# Patient Record
Sex: Male | Born: 1945
Health system: Southern US, Community
[De-identification: ages and names within clinical notes are randomized; demographics above are authoritative.]

## PROBLEM LIST (undated history)

## (undated) DIAGNOSIS — I10 Essential (primary) hypertension: Secondary | ICD-10-CM

## (undated) DIAGNOSIS — E119 Type 2 diabetes mellitus without complications: Secondary | ICD-10-CM

## (undated) DIAGNOSIS — H269 Unspecified cataract: Secondary | ICD-10-CM

## (undated) HISTORY — DX: Unspecified cataract: H26.9

## (undated) HISTORY — DX: Type 2 diabetes mellitus without complications: E11.9

## (undated) HISTORY — PX: HERNIA REPAIR: SHX51

## (undated) HISTORY — PX: ROTATOR CUFF REPAIR: SHX139

---

## 2000-04-23 ENCOUNTER — Emergency Department (HOSPITAL_COMMUNITY): Admission: EM | Admit: 2000-04-23 | Discharge: 2000-04-23 | Payer: Self-pay

## 2000-09-14 ENCOUNTER — Emergency Department (HOSPITAL_COMMUNITY): Admission: EM | Admit: 2000-09-14 | Discharge: 2000-09-15 | Payer: Self-pay | Admitting: Emergency Medicine

## 2000-09-15 ENCOUNTER — Emergency Department (HOSPITAL_COMMUNITY): Admission: EM | Admit: 2000-09-15 | Discharge: 2000-09-16 | Payer: Self-pay | Admitting: Emergency Medicine

## 2000-11-27 ENCOUNTER — Encounter: Payer: Self-pay | Admitting: Emergency Medicine

## 2000-11-27 ENCOUNTER — Inpatient Hospital Stay (HOSPITAL_COMMUNITY): Admission: EM | Admit: 2000-11-27 | Discharge: 2000-11-28 | Payer: Self-pay | Admitting: Emergency Medicine

## 2000-11-28 ENCOUNTER — Emergency Department (HOSPITAL_COMMUNITY): Admission: EM | Admit: 2000-11-28 | Discharge: 2000-11-29 | Payer: Self-pay | Admitting: Emergency Medicine

## 2001-03-05 ENCOUNTER — Emergency Department (HOSPITAL_COMMUNITY): Admission: EM | Admit: 2001-03-05 | Discharge: 2001-03-05 | Payer: Self-pay

## 2003-02-08 ENCOUNTER — Emergency Department (HOSPITAL_COMMUNITY): Admission: EM | Admit: 2003-02-08 | Discharge: 2003-02-09 | Payer: Self-pay | Admitting: Emergency Medicine

## 2004-05-05 ENCOUNTER — Inpatient Hospital Stay (HOSPITAL_COMMUNITY): Admission: RE | Admit: 2004-05-05 | Discharge: 2004-05-11 | Payer: Self-pay | Admitting: Psychiatry

## 2004-05-05 ENCOUNTER — Emergency Department (HOSPITAL_COMMUNITY): Admission: EM | Admit: 2004-05-05 | Discharge: 2004-05-05 | Payer: Self-pay | Admitting: Emergency Medicine

## 2004-05-05 ENCOUNTER — Ambulatory Visit: Payer: Self-pay | Admitting: Psychiatry

## 2004-06-10 ENCOUNTER — Ambulatory Visit: Payer: Self-pay

## 2012-03-28 LAB — HM COLONOSCOPY: HM Colonoscopy: NORMAL

## 2014-08-04 ENCOUNTER — Ambulatory Visit: Payer: Self-pay | Admitting: Family Medicine

## 2015-06-30 ENCOUNTER — Telehealth: Payer: Self-pay | Admitting: Behavioral Health

## 2015-06-30 ENCOUNTER — Encounter: Payer: Self-pay | Admitting: Behavioral Health

## 2015-06-30 NOTE — Telephone Encounter (Signed)
Pre-Visit Call completed with patient and chart updated.   Pre-Visit Info documented in Specialty Comments under [employer].    

## 2015-07-02 ENCOUNTER — Ambulatory Visit: Payer: Self-pay | Admitting: Family Medicine

## 2017-04-04 DIAGNOSIS — E1129 Type 2 diabetes mellitus with other diabetic kidney complication: Secondary | ICD-10-CM | POA: Diagnosis not present

## 2017-04-04 DIAGNOSIS — Z7984 Long term (current) use of oral hypoglycemic drugs: Secondary | ICD-10-CM | POA: Diagnosis not present

## 2017-04-04 DIAGNOSIS — H2513 Age-related nuclear cataract, bilateral: Secondary | ICD-10-CM | POA: Diagnosis not present

## 2017-04-04 DIAGNOSIS — H25013 Cortical age-related cataract, bilateral: Secondary | ICD-10-CM | POA: Diagnosis not present

## 2017-04-07 DIAGNOSIS — E785 Hyperlipidemia, unspecified: Secondary | ICD-10-CM | POA: Diagnosis not present

## 2017-04-07 DIAGNOSIS — F5101 Primary insomnia: Secondary | ICD-10-CM | POA: Diagnosis not present

## 2017-04-30 ENCOUNTER — Emergency Department (HOSPITAL_BASED_OUTPATIENT_CLINIC_OR_DEPARTMENT_OTHER)
Admission: EM | Admit: 2017-04-30 | Discharge: 2017-04-30 | Disposition: A | Discharge status: Home / Self Care | Payer: PPO | Attending: Emergency Medicine | Admitting: Emergency Medicine

## 2017-04-30 ENCOUNTER — Emergency Department (HOSPITAL_BASED_OUTPATIENT_CLINIC_OR_DEPARTMENT_OTHER): Payer: PPO

## 2017-04-30 ENCOUNTER — Encounter (HOSPITAL_BASED_OUTPATIENT_CLINIC_OR_DEPARTMENT_OTHER): Payer: Self-pay | Admitting: *Deleted

## 2017-04-30 ENCOUNTER — Other Ambulatory Visit: Payer: Self-pay

## 2017-04-30 DIAGNOSIS — E119 Type 2 diabetes mellitus without complications: Secondary | ICD-10-CM | POA: Diagnosis not present

## 2017-04-30 DIAGNOSIS — J069 Acute upper respiratory infection, unspecified: Secondary | ICD-10-CM | POA: Insufficient documentation

## 2017-04-30 DIAGNOSIS — R05 Cough: Secondary | ICD-10-CM | POA: Diagnosis not present

## 2017-04-30 DIAGNOSIS — Z79899 Other long term (current) drug therapy: Secondary | ICD-10-CM | POA: Insufficient documentation

## 2017-04-30 DIAGNOSIS — I10 Essential (primary) hypertension: Secondary | ICD-10-CM | POA: Insufficient documentation

## 2017-04-30 DIAGNOSIS — Z7984 Long term (current) use of oral hypoglycemic drugs: Secondary | ICD-10-CM | POA: Insufficient documentation

## 2017-04-30 DIAGNOSIS — R9431 Abnormal electrocardiogram [ECG] [EKG]: Secondary | ICD-10-CM | POA: Diagnosis not present

## 2017-04-30 HISTORY — DX: Essential (primary) hypertension: I10

## 2017-04-30 MED ORDER — KETOROLAC TROMETHAMINE 30 MG/ML IJ SOLN
30.0000 mg | Freq: Once | INTRAMUSCULAR | Status: AC
  Administered 2017-04-30: 30 mg via INTRAMUSCULAR
  Filled 2017-04-30: qty 1

## 2017-04-30 NOTE — ED Triage Notes (Signed)
Cough, body aches, URI sx x 2 -3 days. Denies known fever

## 2017-04-30 NOTE — ED Notes (Signed)
Reports pain is worse in chest now. EKG ordered

## 2017-04-30 NOTE — ED Provider Notes (Signed)
[facility] EMERGENCY DEPARTMENT Provider Note   CSN: [id number] Arrival date & time: 04/30/17  1502     History   Chief Complaint Chief Complaint  Patient presents with  . Generalized Body Aches    HPI Jordan Lee is a 72 y.o. male.  Pt presents to the ED today with cough and body aches.  Pt said that he has had a cough and sinus congestion.  No fever.  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  He was able to play golf today.        Past Medical History:  Diagnosis Date  . Cataract    Right eye  . Diabetes mellitus without complication ([place])   . Hypertension     There are no active problems to display for this patient.   Past Surgical History:  Procedure Laterality Date  . HERNIA REPAIR     Inguinal  . ROTATOR CUFF REPAIR         Home Medications    Prior to Admission medications   Medication Sig Start Date End Date Taking? Authorizing Provider  cetirizine (ZYRTEC) 10 MG tablet Take 10 mg by mouth as needed for allergies.   Yes [provider]  lisinopril-hydrochlorothiazide (PRINZIDE,ZESTORETIC) 20-12.5 MG tablet Take 1 tablet by mouth daily.   Yes [provider]  metformin (FORTAMET) 500 MG (OSM) 24 hr tablet Take 500 mg by mouth 2 (two) times daily with a meal.   Yes [provider]  SIMVASTATIN PO Take by mouth.   Yes [provider]  zolpidem (AMBIEN) 10 MG tablet Take 10 mg by mouth at bedtime as needed for sleep.   Yes [provider]    Family History Family History  Problem Relation Age of Onset  . COPD Mother   . Hypertension Mother   . Cancer Father        Lung cancer  . Cancer Sister        Pancreatic cancer    Social History Social History   Tobacco Use  . Smoking status: Never Smoker  . Smokeless tobacco: Never Used  Substance Use Topics  . Alcohol use: No    Frequency: Never  . Drug use: No     Allergies     Shellfish allergy   Review of Systems Review of Systems  HENT: Positive for congestion and postnasal drip.   Respiratory: Positive for cough.   All other systems reviewed and are negative.    Physical Exam Updated Vital Signs BP 122/81 (BP Location: Left Arm)   Pulse 96   Temp 98.9 F (37.2 C) (Oral)   Resp 20   Ht 6\' 2"  (1.88 m)   Wt 90.7 kg (200 lb)   BMI 25.68 kg/m   Physical Exam  Constitutional: He is oriented to person, place, and time. He appears well-developed and well-nourished.  HENT:  Head: Normocephalic and atraumatic.  Right Ear: External ear normal.  Left Ear: External ear normal.  Nose: Nose normal.  Mouth/Throat: Oropharynx is clear and moist.  Eyes: Conjunctivae and EOM are normal. Pupils are equal, round, and reactive to light.  Neck: Normal range of motion. Neck supple.  Cardiovascular: Normal rate, regular rhythm, normal heart sounds and intact distal pulses.  Pulmonary/Chest: Effort normal and breath sounds normal.  Abdominal: Soft. Bowel sounds are normal.  Musculoskeletal: Normal range of motion.  Neurological: He is alert and oriented to person, place,  and time.  Skin: Skin is warm. Capillary refill takes less than 2 seconds.  Psychiatric: He has a normal mood and affect. His behavior is normal. Judgment and thought content normal.  Nursing note and vitals reviewed.    ED Treatments / Results  Labs (all labs ordered are listed, but only abnormal results are displayed) Labs Reviewed  INFLUENZA PANEL BY PCR (TYPE A & B)    EKG  EKG Interpretation  Date/Time:  Sunday April 30 2017 15:14:34 EST Ventricular Rate:  95 PR Interval:  184 QRS Duration: 88 QT Interval:  338 QTC Calculation: 424 R Axis:   61 Text Interpretation:  Normal sinus rhythm Possible Inferior infarct , age undetermined Cannot rule out Anterior infarct , age undetermined Abnormal ECG Confirmed by Isla Pence [phone]) on 04/30/2017 4:59:45 PM        Radiology Dg Chest 2 View  Result Date: 04/30/2017 CLINICAL DATA:  Cough for 2 days. EXAM: CHEST  2 VIEW COMPARISON:  April 20, 2015 FINDINGS: The heart size and mediastinal contours are within normal limits. Both lungs are clear. The visualized skeletal structures are unremarkable. IMPRESSION: No active cardiopulmonary disease. Electronically Signed   By: Dorise Bullion III M.D   On: 04/30/2017 17:28    Procedures Procedures (including critical care time)  Medications Ordered in ED Medications  ketorolac (TORADOL) 30 MG/ML injection 30 mg (30 mg Intramuscular Given 04/30/17 1727)     Initial Impression / Assessment and Plan / ED Course  I have reviewed the triage vital signs and the nursing notes.  Pertinent labs & imaging results that were available during my care of the patient were reviewed by me and considered in my medical decision making (see chart for details).    Pt looks good.  Flu is still pending.  Pt will be called if +.  He knows to return if worse.   Final Clinical Impressions(s) / ED Diagnoses   Final diagnoses:  Viral upper respiratory tract infection    ED Discharge Orders    None       Isla Pence, MD 04/30/17 [phone]

## 2017-05-01 LAB — INFLUENZA PANEL BY PCR (TYPE A & B)
Influenza A By PCR: NEGATIVE
Influenza B By PCR: NEGATIVE

## 2017-05-03 DIAGNOSIS — J0140 Acute pansinusitis, unspecified: Secondary | ICD-10-CM | POA: Diagnosis not present

## 2017-05-03 DIAGNOSIS — J9801 Acute bronchospasm: Secondary | ICD-10-CM | POA: Diagnosis not present

## 2017-05-18 DIAGNOSIS — R05 Cough: Secondary | ICD-10-CM | POA: Diagnosis not present

## 2017-05-18 DIAGNOSIS — J3089 Other allergic rhinitis: Secondary | ICD-10-CM | POA: Diagnosis not present

## 2017-06-21 DIAGNOSIS — Z23 Encounter for immunization: Secondary | ICD-10-CM | POA: Diagnosis not present

## 2017-07-10 DIAGNOSIS — Z0000 Encounter for general adult medical examination without abnormal findings: Secondary | ICD-10-CM | POA: Diagnosis not present

## 2017-07-10 DIAGNOSIS — E785 Hyperlipidemia, unspecified: Secondary | ICD-10-CM | POA: Diagnosis not present

## 2017-07-10 DIAGNOSIS — E669 Obesity, unspecified: Secondary | ICD-10-CM | POA: Diagnosis not present

## 2017-07-10 DIAGNOSIS — E1169 Type 2 diabetes mellitus with other specified complication: Secondary | ICD-10-CM | POA: Diagnosis not present

## 2017-09-06 DIAGNOSIS — R1032 Left lower quadrant pain: Secondary | ICD-10-CM | POA: Diagnosis not present

## 2017-11-17 DIAGNOSIS — H2513 Age-related nuclear cataract, bilateral: Secondary | ICD-10-CM | POA: Diagnosis not present

## 2017-11-17 DIAGNOSIS — H25013 Cortical age-related cataract, bilateral: Secondary | ICD-10-CM | POA: Diagnosis not present

## 2017-12-14 DIAGNOSIS — E1159 Type 2 diabetes mellitus with other circulatory complications: Secondary | ICD-10-CM | POA: Diagnosis not present

## 2017-12-14 DIAGNOSIS — E785 Hyperlipidemia, unspecified: Secondary | ICD-10-CM | POA: Diagnosis not present

## 2017-12-14 DIAGNOSIS — I152 Hypertension secondary to endocrine disorders: Secondary | ICD-10-CM | POA: Diagnosis not present

## 2017-12-14 DIAGNOSIS — F5101 Primary insomnia: Secondary | ICD-10-CM | POA: Diagnosis not present

## 2017-12-14 DIAGNOSIS — J3089 Other allergic rhinitis: Secondary | ICD-10-CM | POA: Diagnosis not present

## 2017-12-14 DIAGNOSIS — R809 Proteinuria, unspecified: Secondary | ICD-10-CM | POA: Diagnosis not present

## 2017-12-14 DIAGNOSIS — E1169 Type 2 diabetes mellitus with other specified complication: Secondary | ICD-10-CM | POA: Diagnosis not present

## 2017-12-28 DIAGNOSIS — F5101 Primary insomnia: Secondary | ICD-10-CM | POA: Diagnosis not present

## 2018-01-23 DIAGNOSIS — R079 Chest pain, unspecified: Secondary | ICD-10-CM | POA: Diagnosis not present

## 2018-01-23 DIAGNOSIS — E785 Hyperlipidemia, unspecified: Secondary | ICD-10-CM | POA: Diagnosis not present

## 2018-01-23 DIAGNOSIS — R002 Palpitations: Secondary | ICD-10-CM | POA: Diagnosis not present

## 2018-01-30 DIAGNOSIS — R52 Pain, unspecified: Secondary | ICD-10-CM | POA: Diagnosis not present

## 2018-02-01 DIAGNOSIS — G478 Other sleep disorders: Secondary | ICD-10-CM | POA: Diagnosis not present

## 2018-02-12 DIAGNOSIS — M79671 Pain in right foot: Secondary | ICD-10-CM | POA: Diagnosis not present

## 2018-02-12 DIAGNOSIS — M7731 Calcaneal spur, right foot: Secondary | ICD-10-CM | POA: Diagnosis not present

## 2018-02-12 DIAGNOSIS — M19071 Primary osteoarthritis, right ankle and foot: Secondary | ICD-10-CM | POA: Diagnosis not present

## 2018-02-28 DIAGNOSIS — R1031 Right lower quadrant pain: Secondary | ICD-10-CM | POA: Diagnosis not present

## 2018-04-05 DIAGNOSIS — D1721 Benign lipomatous neoplasm of skin and subcutaneous tissue of right arm: Secondary | ICD-10-CM | POA: Diagnosis not present

## 2018-04-05 DIAGNOSIS — R109 Unspecified abdominal pain: Secondary | ICD-10-CM | POA: Diagnosis not present

## 2018-04-22 DIAGNOSIS — L723 Sebaceous cyst: Secondary | ICD-10-CM | POA: Diagnosis not present

## 2018-04-22 DIAGNOSIS — J019 Acute sinusitis, unspecified: Secondary | ICD-10-CM | POA: Diagnosis not present

## 2018-05-09 DIAGNOSIS — J9811 Atelectasis: Secondary | ICD-10-CM | POA: Diagnosis not present

## 2018-05-09 DIAGNOSIS — R0982 Postnasal drip: Secondary | ICD-10-CM | POA: Diagnosis not present

## 2018-05-09 DIAGNOSIS — R05 Cough: Secondary | ICD-10-CM | POA: Diagnosis not present

## 2018-05-30 DIAGNOSIS — K8020 Calculus of gallbladder without cholecystitis without obstruction: Secondary | ICD-10-CM | POA: Diagnosis not present

## 2018-05-30 DIAGNOSIS — R1011 Right upper quadrant pain: Secondary | ICD-10-CM | POA: Diagnosis not present

## 2018-05-31 DIAGNOSIS — Z7984 Long term (current) use of oral hypoglycemic drugs: Secondary | ICD-10-CM | POA: Diagnosis not present

## 2018-05-31 DIAGNOSIS — H5211 Myopia, right eye: Secondary | ICD-10-CM | POA: Diagnosis not present

## 2018-05-31 DIAGNOSIS — H524 Presbyopia: Secondary | ICD-10-CM | POA: Diagnosis not present

## 2018-05-31 DIAGNOSIS — Z83511 Family history of glaucoma: Secondary | ICD-10-CM | POA: Diagnosis not present

## 2018-05-31 DIAGNOSIS — H02834 Dermatochalasis of left upper eyelid: Secondary | ICD-10-CM | POA: Diagnosis not present

## 2018-05-31 DIAGNOSIS — H52203 Unspecified astigmatism, bilateral: Secondary | ICD-10-CM | POA: Diagnosis not present

## 2018-05-31 DIAGNOSIS — H43393 Other vitreous opacities, bilateral: Secondary | ICD-10-CM | POA: Diagnosis not present

## 2018-05-31 DIAGNOSIS — H2513 Age-related nuclear cataract, bilateral: Secondary | ICD-10-CM | POA: Diagnosis not present

## 2018-05-31 DIAGNOSIS — H02831 Dermatochalasis of right upper eyelid: Secondary | ICD-10-CM | POA: Diagnosis not present

## 2018-05-31 DIAGNOSIS — H25013 Cortical age-related cataract, bilateral: Secondary | ICD-10-CM | POA: Diagnosis not present

## 2018-05-31 DIAGNOSIS — E119 Type 2 diabetes mellitus without complications: Secondary | ICD-10-CM | POA: Diagnosis not present

## 2018-06-05 DIAGNOSIS — K8020 Calculus of gallbladder without cholecystitis without obstruction: Secondary | ICD-10-CM | POA: Diagnosis not present

## 2018-06-13 DIAGNOSIS — J40 Bronchitis, not specified as acute or chronic: Secondary | ICD-10-CM | POA: Diagnosis not present

## 2018-07-24 DIAGNOSIS — K819 Cholecystitis, unspecified: Secondary | ICD-10-CM | POA: Diagnosis not present

## 2018-07-24 DIAGNOSIS — K8020 Calculus of gallbladder without cholecystitis without obstruction: Secondary | ICD-10-CM | POA: Diagnosis not present

## 2018-11-15 DIAGNOSIS — K8020 Calculus of gallbladder without cholecystitis without obstruction: Secondary | ICD-10-CM | POA: Diagnosis not present

## 2018-11-29 DIAGNOSIS — M549 Dorsalgia, unspecified: Secondary | ICD-10-CM | POA: Diagnosis not present

## 2018-11-30 DIAGNOSIS — K8020 Calculus of gallbladder without cholecystitis without obstruction: Secondary | ICD-10-CM | POA: Diagnosis not present

## 2018-11-30 DIAGNOSIS — R1011 Right upper quadrant pain: Secondary | ICD-10-CM | POA: Diagnosis not present

## 2018-11-30 DIAGNOSIS — K629 Disease of anus and rectum, unspecified: Secondary | ICD-10-CM | POA: Diagnosis not present

## 2018-11-30 DIAGNOSIS — I700 Atherosclerosis of aorta: Secondary | ICD-10-CM | POA: Diagnosis not present

## 2018-11-30 DIAGNOSIS — K4090 Unilateral inguinal hernia, without obstruction or gangrene, not specified as recurrent: Secondary | ICD-10-CM | POA: Diagnosis not present

## 2018-11-30 DIAGNOSIS — N400 Enlarged prostate without lower urinary tract symptoms: Secondary | ICD-10-CM | POA: Diagnosis not present

## 2018-11-30 DIAGNOSIS — M47816 Spondylosis without myelopathy or radiculopathy, lumbar region: Secondary | ICD-10-CM | POA: Diagnosis not present

## 2018-11-30 DIAGNOSIS — K5730 Diverticulosis of large intestine without perforation or abscess without bleeding: Secondary | ICD-10-CM | POA: Diagnosis not present

## 2018-11-30 DIAGNOSIS — R1031 Right lower quadrant pain: Secondary | ICD-10-CM | POA: Diagnosis not present

## 2018-11-30 DIAGNOSIS — M7918 Myalgia, other site: Secondary | ICD-10-CM | POA: Diagnosis not present

## 2018-12-10 ENCOUNTER — Encounter (HOSPITAL_BASED_OUTPATIENT_CLINIC_OR_DEPARTMENT_OTHER): Payer: Self-pay | Admitting: Emergency Medicine

## 2018-12-10 ENCOUNTER — Other Ambulatory Visit: Payer: Self-pay

## 2018-12-10 ENCOUNTER — Emergency Department (HOSPITAL_BASED_OUTPATIENT_CLINIC_OR_DEPARTMENT_OTHER)
Admission: EM | Admit: 2018-12-10 | Discharge: 2018-12-10 | Discharge status: Against Medical Advice | Payer: PPO | Attending: Emergency Medicine | Admitting: Emergency Medicine

## 2018-12-10 DIAGNOSIS — Z20828 Contact with and (suspected) exposure to other viral communicable diseases: Secondary | ICD-10-CM | POA: Diagnosis not present

## 2018-12-10 DIAGNOSIS — R51 Headache: Secondary | ICD-10-CM | POA: Diagnosis not present

## 2018-12-10 DIAGNOSIS — Z5321 Procedure and treatment not carried out due to patient leaving prior to being seen by health care provider: Secondary | ICD-10-CM | POA: Insufficient documentation

## 2018-12-10 DIAGNOSIS — I10 Essential (primary) hypertension: Secondary | ICD-10-CM | POA: Diagnosis present

## 2018-12-10 DIAGNOSIS — Z01812 Encounter for preprocedural laboratory examination: Secondary | ICD-10-CM | POA: Diagnosis not present

## 2018-12-10 DIAGNOSIS — K8020 Calculus of gallbladder without cholecystitis without obstruction: Secondary | ICD-10-CM | POA: Diagnosis not present

## 2018-12-10 NOTE — ED Triage Notes (Signed)
Pt reports elevated BPs today, reports this morning it was 150/100. States headaches today as well, improved with 1000mg  tylenol. States no missed doses of BP meds, no changes in meds.

## 2018-12-11 DIAGNOSIS — R030 Elevated blood-pressure reading, without diagnosis of hypertension: Secondary | ICD-10-CM | POA: Diagnosis not present

## 2018-12-12 DIAGNOSIS — I10 Essential (primary) hypertension: Secondary | ICD-10-CM | POA: Diagnosis not present

## 2018-12-12 DIAGNOSIS — R51 Headache: Secondary | ICD-10-CM | POA: Diagnosis not present

## 2018-12-12 DIAGNOSIS — E1159 Type 2 diabetes mellitus with other circulatory complications: Secondary | ICD-10-CM | POA: Diagnosis not present

## 2018-12-13 DIAGNOSIS — E1159 Type 2 diabetes mellitus with other circulatory complications: Secondary | ICD-10-CM | POA: Diagnosis not present

## 2018-12-13 DIAGNOSIS — K8020 Calculus of gallbladder without cholecystitis without obstruction: Secondary | ICD-10-CM | POA: Diagnosis not present

## 2018-12-13 DIAGNOSIS — I10 Essential (primary) hypertension: Secondary | ICD-10-CM | POA: Diagnosis not present

## 2018-12-14 DIAGNOSIS — K219 Gastro-esophageal reflux disease without esophagitis: Secondary | ICD-10-CM | POA: Diagnosis not present

## 2018-12-14 DIAGNOSIS — K8000 Calculus of gallbladder with acute cholecystitis without obstruction: Secondary | ICD-10-CM | POA: Diagnosis not present

## 2018-12-14 DIAGNOSIS — K8012 Calculus of gallbladder with acute and chronic cholecystitis without obstruction: Secondary | ICD-10-CM | POA: Diagnosis not present

## 2018-12-14 DIAGNOSIS — K8020 Calculus of gallbladder without cholecystitis without obstruction: Secondary | ICD-10-CM | POA: Diagnosis not present

## 2018-12-14 DIAGNOSIS — K8010 Calculus of gallbladder with chronic cholecystitis without obstruction: Secondary | ICD-10-CM | POA: Diagnosis not present

## 2018-12-20 DIAGNOSIS — H5213 Myopia, bilateral: Secondary | ICD-10-CM | POA: Diagnosis not present

## 2018-12-20 DIAGNOSIS — H524 Presbyopia: Secondary | ICD-10-CM | POA: Diagnosis not present

## 2018-12-20 DIAGNOSIS — H52223 Regular astigmatism, bilateral: Secondary | ICD-10-CM | POA: Diagnosis not present

## 2018-12-20 DIAGNOSIS — H25013 Cortical age-related cataract, bilateral: Secondary | ICD-10-CM | POA: Diagnosis not present

## 2018-12-20 DIAGNOSIS — H2513 Age-related nuclear cataract, bilateral: Secondary | ICD-10-CM | POA: Diagnosis not present

## 2019-02-27 DIAGNOSIS — E119 Type 2 diabetes mellitus without complications: Secondary | ICD-10-CM | POA: Diagnosis not present

## 2019-03-06 DIAGNOSIS — E1129 Type 2 diabetes mellitus with other diabetic kidney complication: Secondary | ICD-10-CM | POA: Diagnosis not present

## 2019-03-06 DIAGNOSIS — I77811 Abdominal aortic ectasia: Secondary | ICD-10-CM | POA: Diagnosis not present

## 2019-03-06 DIAGNOSIS — M5416 Radiculopathy, lumbar region: Secondary | ICD-10-CM | POA: Diagnosis not present

## 2019-03-06 DIAGNOSIS — E1159 Type 2 diabetes mellitus with other circulatory complications: Secondary | ICD-10-CM | POA: Diagnosis not present

## 2019-03-06 DIAGNOSIS — I10 Essential (primary) hypertension: Secondary | ICD-10-CM | POA: Diagnosis not present

## 2019-03-06 DIAGNOSIS — R809 Proteinuria, unspecified: Secondary | ICD-10-CM | POA: Diagnosis not present

## 2019-06-17 DIAGNOSIS — R079 Chest pain, unspecified: Secondary | ICD-10-CM | POA: Diagnosis not present

## 2019-06-17 DIAGNOSIS — R072 Precordial pain: Secondary | ICD-10-CM | POA: Diagnosis not present

## 2019-06-17 DIAGNOSIS — R519 Headache, unspecified: Secondary | ICD-10-CM | POA: Diagnosis not present

## 2019-06-17 DIAGNOSIS — I493 Ventricular premature depolarization: Secondary | ICD-10-CM | POA: Diagnosis not present

## 2019-06-17 DIAGNOSIS — R0602 Shortness of breath: Secondary | ICD-10-CM | POA: Diagnosis not present

## 2019-06-17 DIAGNOSIS — R05 Cough: Secondary | ICD-10-CM | POA: Diagnosis not present

## 2019-06-27 DIAGNOSIS — R0989 Other specified symptoms and signs involving the circulatory and respiratory systems: Secondary | ICD-10-CM | POA: Diagnosis not present

## 2019-06-27 DIAGNOSIS — E1159 Type 2 diabetes mellitus with other circulatory complications: Secondary | ICD-10-CM | POA: Diagnosis not present

## 2019-06-27 DIAGNOSIS — I10 Essential (primary) hypertension: Secondary | ICD-10-CM | POA: Diagnosis not present

## 2019-06-27 DIAGNOSIS — R05 Cough: Secondary | ICD-10-CM | POA: Diagnosis not present

## 2019-07-18 DIAGNOSIS — H43393 Other vitreous opacities, bilateral: Secondary | ICD-10-CM | POA: Diagnosis not present

## 2019-07-18 DIAGNOSIS — H524 Presbyopia: Secondary | ICD-10-CM | POA: Diagnosis not present

## 2019-07-18 DIAGNOSIS — E119 Type 2 diabetes mellitus without complications: Secondary | ICD-10-CM | POA: Diagnosis not present

## 2019-07-18 DIAGNOSIS — H25013 Cortical age-related cataract, bilateral: Secondary | ICD-10-CM | POA: Diagnosis not present

## 2019-07-18 DIAGNOSIS — H02834 Dermatochalasis of left upper eyelid: Secondary | ICD-10-CM | POA: Diagnosis not present

## 2019-07-18 DIAGNOSIS — H52203 Unspecified astigmatism, bilateral: Secondary | ICD-10-CM | POA: Diagnosis not present

## 2019-07-18 DIAGNOSIS — H2513 Age-related nuclear cataract, bilateral: Secondary | ICD-10-CM | POA: Diagnosis not present

## 2019-07-18 DIAGNOSIS — H02831 Dermatochalasis of right upper eyelid: Secondary | ICD-10-CM | POA: Diagnosis not present

## 2019-07-18 DIAGNOSIS — H5213 Myopia, bilateral: Secondary | ICD-10-CM | POA: Diagnosis not present

## 2019-07-18 DIAGNOSIS — Z83511 Family history of glaucoma: Secondary | ICD-10-CM | POA: Diagnosis not present

## 2019-07-18 DIAGNOSIS — Z7984 Long term (current) use of oral hypoglycemic drugs: Secondary | ICD-10-CM | POA: Diagnosis not present

## 2019-08-01 ENCOUNTER — Ambulatory Visit: Payer: Self-pay | Admitting: Allergy and Immunology

## 2019-08-02 DIAGNOSIS — J209 Acute bronchitis, unspecified: Secondary | ICD-10-CM | POA: Diagnosis not present

## 2019-08-02 DIAGNOSIS — J3089 Other allergic rhinitis: Secondary | ICD-10-CM | POA: Diagnosis not present

## 2019-08-02 DIAGNOSIS — R0602 Shortness of breath: Secondary | ICD-10-CM | POA: Diagnosis not present

## 2019-08-16 DIAGNOSIS — R5383 Other fatigue: Secondary | ICD-10-CM | POA: Diagnosis not present

## 2019-08-16 DIAGNOSIS — R0602 Shortness of breath: Secondary | ICD-10-CM | POA: Diagnosis not present

## 2019-08-16 DIAGNOSIS — R350 Frequency of micturition: Secondary | ICD-10-CM | POA: Diagnosis not present

## 2019-08-16 DIAGNOSIS — M129 Arthropathy, unspecified: Secondary | ICD-10-CM | POA: Diagnosis not present

## 2019-08-16 DIAGNOSIS — Z13220 Encounter for screening for lipoid disorders: Secondary | ICD-10-CM | POA: Diagnosis not present

## 2019-08-16 DIAGNOSIS — Z23 Encounter for immunization: Secondary | ICD-10-CM | POA: Diagnosis not present

## 2019-08-16 DIAGNOSIS — E559 Vitamin D deficiency, unspecified: Secondary | ICD-10-CM | POA: Diagnosis not present

## 2019-08-16 DIAGNOSIS — Z114 Encounter for screening for human immunodeficiency virus [HIV]: Secondary | ICD-10-CM | POA: Diagnosis not present

## 2019-08-16 DIAGNOSIS — Z131 Encounter for screening for diabetes mellitus: Secondary | ICD-10-CM | POA: Diagnosis not present

## 2019-08-16 DIAGNOSIS — Z0000 Encounter for general adult medical examination without abnormal findings: Secondary | ICD-10-CM | POA: Diagnosis not present

## 2019-08-16 DIAGNOSIS — Z125 Encounter for screening for malignant neoplasm of prostate: Secondary | ICD-10-CM | POA: Diagnosis not present

## 2019-08-16 DIAGNOSIS — M79641 Pain in right hand: Secondary | ICD-10-CM | POA: Diagnosis not present

## 2019-09-06 DIAGNOSIS — M79641 Pain in right hand: Secondary | ICD-10-CM | POA: Diagnosis not present

## 2019-09-06 DIAGNOSIS — E1142 Type 2 diabetes mellitus with diabetic polyneuropathy: Secondary | ICD-10-CM | POA: Diagnosis not present

## 2019-09-06 DIAGNOSIS — E7800 Pure hypercholesterolemia, unspecified: Secondary | ICD-10-CM | POA: Diagnosis not present

## 2019-09-06 DIAGNOSIS — E559 Vitamin D deficiency, unspecified: Secondary | ICD-10-CM | POA: Diagnosis not present

## 2019-09-11 ENCOUNTER — Ambulatory Visit: Payer: Self-pay | Admitting: Allergy and Immunology

## 2019-09-12 DIAGNOSIS — L309 Dermatitis, unspecified: Secondary | ICD-10-CM | POA: Diagnosis not present

## 2019-09-12 DIAGNOSIS — J309 Allergic rhinitis, unspecified: Secondary | ICD-10-CM | POA: Diagnosis not present

## 2019-09-22 ENCOUNTER — Other Ambulatory Visit: Payer: Self-pay

## 2019-09-22 ENCOUNTER — Encounter (HOSPITAL_BASED_OUTPATIENT_CLINIC_OR_DEPARTMENT_OTHER): Payer: Self-pay | Admitting: Emergency Medicine

## 2019-09-22 ENCOUNTER — Emergency Department (HOSPITAL_BASED_OUTPATIENT_CLINIC_OR_DEPARTMENT_OTHER)
Admission: EM | Admit: 2019-09-22 | Discharge: 2019-09-22 | Disposition: A | Discharge status: Home / Self Care | Payer: PPO | Attending: Emergency Medicine | Admitting: Emergency Medicine

## 2019-09-22 ENCOUNTER — Emergency Department (HOSPITAL_BASED_OUTPATIENT_CLINIC_OR_DEPARTMENT_OTHER): Payer: PPO

## 2019-09-22 DIAGNOSIS — Z7984 Long term (current) use of oral hypoglycemic drugs: Secondary | ICD-10-CM | POA: Diagnosis not present

## 2019-09-22 DIAGNOSIS — I10 Essential (primary) hypertension: Secondary | ICD-10-CM | POA: Diagnosis not present

## 2019-09-22 DIAGNOSIS — M25512 Pain in left shoulder: Secondary | ICD-10-CM | POA: Diagnosis not present

## 2019-09-22 DIAGNOSIS — W19XXXA Unspecified fall, initial encounter: Secondary | ICD-10-CM | POA: Insufficient documentation

## 2019-09-22 DIAGNOSIS — Y929 Unspecified place or not applicable: Secondary | ICD-10-CM | POA: Insufficient documentation

## 2019-09-22 DIAGNOSIS — Y999 Unspecified external cause status: Secondary | ICD-10-CM | POA: Insufficient documentation

## 2019-09-22 DIAGNOSIS — Y939 Activity, unspecified: Secondary | ICD-10-CM | POA: Diagnosis not present

## 2019-09-22 DIAGNOSIS — Z79899 Other long term (current) drug therapy: Secondary | ICD-10-CM | POA: Diagnosis not present

## 2019-09-22 DIAGNOSIS — E119 Type 2 diabetes mellitus without complications: Secondary | ICD-10-CM | POA: Insufficient documentation

## 2019-09-22 NOTE — Discharge Instructions (Signed)
X-ray showed no acute fracture dislocation.  Please continue to take Tylenol Motrin as needed for pain.  Use a sling for comfort as needed.  Follow-up with sports medicine.

## 2019-09-22 NOTE — ED Provider Notes (Signed)
[facility] EMERGENCY DEPARTMENT Provider Note   CSN: [id number] Arrival date & time: 09/22/19  3785     History Chief Complaint  Patient presents with  . Fall  . Shoulder Pain    Jordan Lee is a 74 y.o. male.  Patient with mechanical fall several days ago landing on his left arm and now with left shoulder pain for the last several days.  Decreased range of motion of the left shoulder.  The history is provided by the patient.  Fall This is a new problem. The current episode started more than 2 days ago. The problem occurs daily. The problem has not changed since onset.Pertinent negatives include no chest pain, no abdominal pain, no headaches and no shortness of breath. Exacerbated by: movement. Nothing relieves the symptoms. He has tried nothing for the symptoms. The treatment provided no relief.  Shoulder Pain Associated symptoms: no back pain, no fever and no neck pain        Past Medical History:  Diagnosis Date  . Cataract    Right eye  . Diabetes mellitus without complication ([place])   . Hypertension     There are no problems to display for this patient.   Past Surgical History:  Procedure Laterality Date  . HERNIA REPAIR     Inguinal  . ROTATOR CUFF REPAIR         Family History  Problem Relation Age of Onset  . COPD Mother   . Hypertension Mother   . Cancer Father        Lung cancer  . Cancer Sister        Pancreatic cancer    Social History   Tobacco Use  . Smoking status: Never Smoker  . Smokeless tobacco: Never Used  Vaping Use  . Vaping Use: Never used  Substance Use Topics  . Alcohol use: No  . Drug use: No    Home Medications Prior to Admission medications   Medication Sig Start Date End Date Taking? Authorizing Provider  cetirizine (ZYRTEC) 10 MG tablet Take 10 mg by mouth as needed for allergies.    [provider]  lisinopril-hydrochlorothiazide (PRINZIDE,ZESTORETIC) 20-12.5 MG tablet Take 1 tablet by mouth  daily.    [provider]  metformin (FORTAMET) 500 MG (OSM) 24 hr tablet Take 500 mg by mouth 2 (two) times daily with a meal.    [provider]  SIMVASTATIN PO Take by mouth.    [provider]  zolpidem (AMBIEN) 10 MG tablet Take 10 mg by mouth at bedtime as needed for sleep.    [provider]    Allergies    Shellfish allergy  Review of Systems   Review of Systems  Constitutional: Negative for fever.  Respiratory: Negative for shortness of breath.   Cardiovascular: Negative for chest pain.  Gastrointestinal: Negative for abdominal pain.  Musculoskeletal: Positive for arthralgias. Negative for back pain, gait problem, joint swelling, myalgias, neck pain and neck stiffness.  Skin: Negative for color change, rash and wound.  Neurological: Negative for headaches.    Physical Exam Updated Vital Signs BP 133/90   Pulse 79   Temp 97.7 F (36.5 C)   Resp 18   SpO2 94%   Physical Exam Constitutional:      General: He is not in acute distress.    Appearance: He is not ill-appearing.  HENT:     Head: Normocephalic and atraumatic.  Cardiovascular:     Pulses: Normal pulses.  Musculoskeletal:  General: Tenderness present.     Comments: Decreased range of motion at the left shoulder with abduction and external rotation, tenderness with range of motion and tenderness to palpation around the left shoulder  Neurological:     General: No focal deficit present.     Mental Status: He is alert.     Sensory: No sensory deficit.     Motor: No weakness.     Comments: 5+ out of 5 strength throughout, normal sensation     ED Results / Procedures / Treatments   Labs (all labs ordered are listed, but only abnormal results are displayed) Labs Reviewed - No data to display  EKG None  Radiology DG Shoulder Left Port  Result Date: 09/22/2019 CLINICAL DATA:  Left shoulder pain. EXAM: LEFT SHOULDER COMPARISON:  None. FINDINGS: There is no  evidence of fracture or dislocation. There is no evidence of arthropathy or other focal bone abnormality. Soft tissues are unremarkable. IMPRESSION: Negative. Electronically Signed   By: Dorise Bullion III M.D   On: 09/22/2019 09:40    Procedures Procedures (including critical care time)  Medications Ordered in ED Medications - No data to display  ED Course  I have reviewed the triage vital signs and the nursing notes.  Pertinent labs & imaging results that were available during my care of the patient were reviewed by me and considered in my medical decision making (see chart for details).    MDM Rules/Calculators/A&P                          Jordan Lee is a 74 year old male who presents the ED with left shoulder pain.  Normal vitals.  Pain in the left shoulder since a fall several days ago.  Limited range of motion of the left shoulder.  Appears to likely have a muscle strain possibly rotator cuff issue.  Decreased range of motion and pain in the left shoulder with range of motion testing.  X-ray showed no fracture or dislocation of the left shoulder.  Patient neurovascularly and neuromuscularly intact in left upper extremity.  Will place on a sling for comfort and have him follow-up with sports medicine.  Discharged in the ED in good condition.  Given return precautions.  This chart was dictated using voice recognition software.  Despite best efforts to proofread,  errors can occur which can change the documentation meaning.     Final Clinical Impression(s) / ED Diagnoses Final diagnoses:  Fall  Acute pain of left shoulder    Rx / DC Orders ED Discharge Orders    None       Lennice Sites, DO 09/22/19 [phone]

## 2019-09-22 NOTE — ED Triage Notes (Signed)
Pt had a mechanical fall on Friday and landed on left shoulder. Limited range of motion, no visible deformity. Has tried NSAIDS and ice with no relief.

## 2019-09-24 ENCOUNTER — Ambulatory Visit (HOSPITAL_BASED_OUTPATIENT_CLINIC_OR_DEPARTMENT_OTHER)
Admission: RE | Admit: 2019-09-24 | Discharge: 2019-09-24 | Disposition: A | Discharge status: Home / Self Care | Payer: PPO | Source: Ambulatory Visit | Attending: Family Medicine | Admitting: Family Medicine

## 2019-09-24 ENCOUNTER — Ambulatory Visit: Payer: PPO | Admitting: Family Medicine

## 2019-09-24 ENCOUNTER — Other Ambulatory Visit: Payer: Self-pay

## 2019-09-24 ENCOUNTER — Ambulatory Visit: Payer: Self-pay

## 2019-09-24 ENCOUNTER — Encounter: Payer: Self-pay | Admitting: Family Medicine

## 2019-09-24 VITALS — BP 130/82 | HR 79 | Ht 74.0 in | Wt 202.0 lb

## 2019-09-24 DIAGNOSIS — M25512 Pain in left shoulder: Secondary | ICD-10-CM | POA: Diagnosis not present

## 2019-09-24 MED ORDER — METHYLPREDNISOLONE ACETATE 40 MG/ML IJ SUSP
40.0000 mg | Freq: Once | INTRAMUSCULAR | Status: AC
  Administered 2019-09-24: 40 mg via INTRA_ARTICULAR

## 2019-09-24 NOTE — Assessment & Plan Note (Signed)
Injury occurred on 6/25.  No signs of fracture of previous imaging.  Does have some pain with abduction which could indicate traumatic bursitis of the subacromial space. -Counseled on home exercise therapy and supportive care. -Injection. -X-ray. -Could consider glenohumeral injection or physical therapy.

## 2019-09-24 NOTE — Progress Notes (Signed)
Jordan Lee - 74 y.o. male MRN [id number]  Date of birth: 1945-12-09  SUBJECTIVE:  Including CC & ROS.  Chief Complaint  Patient presents with  . Shoulder Injury    left x 09/20/2019    Jordan Lee is a 74 y.o. male that is presenting with left shoulder pain.  He had a fall last Friday at a track meet.  He fell on his shoulder.  Since that time he has had pain in the lateral aspect of the shoulder.  Denies any history of similar pain.  Has had right rotator cuff repair.  Pain is localized to the shoulder.  Has trouble with lifting up and abduction.  Has tried over-the-counter medications.  Pain seems to be staying the same..  Independent review of the left shoulder x-ray from 6/27 shows no acute abnormality.   Review of Systems See HPI   HISTORY: Past Medical, Surgical, Social, and Family History Reviewed & Updated per EMR.   Pertinent Historical Findings include:  Past Medical History:  Diagnosis Date  . Cataract    Right eye  . Diabetes mellitus without complication ([place])   . Hypertension     Past Surgical History:  Procedure Laterality Date  . HERNIA REPAIR     Inguinal  . ROTATOR CUFF REPAIR      Family History  Problem Relation Age of Onset  . COPD Mother   . Hypertension Mother   . Cancer Father        Lung cancer  . Cancer Sister        Pancreatic cancer    Social History   Socioeconomic History  . Marital status: Married    Spouse name: Not on file  . Number of children: Not on file  . Years of education: Not on file  . Highest education level: Not on file  Occupational History  . Not on file  Tobacco Use  . Smoking status: Never Smoker  . Smokeless tobacco: Never Used  Vaping Use  . Vaping Use: Never used  Substance and Sexual Activity  . Alcohol use: No  . Drug use: No  . Sexual activity: Not on file  Other Topics Concern  . Not on file  Social History Narrative  . Not on file   Social Determinants of Health   Financial Resource  Strain:   . Difficulty of Paying Living Expenses:   Food Insecurity:   . Worried About Charity fundraiser in the Last Year:   . Arboriculturist in the Last Year:   Transportation Needs:   . Film/video editor (Medical):   Marland Kitchen Lack of Transportation (Non-Medical):   Physical Activity:   . Days of Exercise per Week:   . Minutes of Exercise per Session:   Stress:   . Feeling of Stress :   Social Connections:   . Frequency of Communication with Friends and Family:   . Frequency of Social Gatherings with Friends and Family:   . Attends Religious Services:   . Active Member of Clubs or Organizations:   . Attends Archivist Meetings:   Marland Kitchen Marital Status:   Intimate Partner Violence:   . Fear of Current or Ex-Partner:   . Emotionally Abused:   Marland Kitchen Physically Abused:   . Sexually Abused:      PHYSICAL EXAM:  VS: BP 130/82   Pulse 79   Ht 6\' 2"  (1.88 m)   Wt 202 lb (91.6 kg)   BMI 25.94 kg/m  Physical Exam Gen: NAD, alert, cooperative with exam, well-appearing MSK:  Left shoulder: Normal internal and external rotation. Pain with abduction. Pain with abduction external rotation. Pain with empty can testing. Neurovascularly intact  Limited ultrasound: Left shoulder:  Normal-appearing biceps tendon long and short axis. Normal subscapularis. Mild chronic changes appearing of the supraspinatus.  Subacromial bursitis appreciated No effusion in the glenohumeral joint.  Summary: Subacromial bursitis and mild chronic change of the supraspinatus  Ultrasound and interpretation by Clearance Coots, MD   Aspiration/Injection Procedure Note Jordan Lee [birth year]  Procedure: Injection Indications: Left shoulder pain  Procedure Details Consent: Risks of procedure as well as the alternatives and risks of each were explained to the (patient/caregiver).  Consent for procedure obtained. Time Out: Verified patient identification, verified procedure, site/side was marked,  verified correct patient position, special equipment/implants available, medications/allergies/relevent history reviewed, required imaging and test results available.  Performed.  The area was cleaned with iodine and alcohol swabs.    The left subacromial space was injected using 1 cc's of 40 mg Depo-Medrol and 4 cc's of 0.25% bupivacaine with a 22 1 1/2" needle.  Ultrasound was used. Images were obtained in long views showing the injection.     A sterile dressing was applied.  Patient did tolerate procedure well.    ASSESSMENT & PLAN:   Acute pain of left shoulder Injury occurred on 6/25.  No signs of fracture of previous imaging.  Does have some pain with abduction which could indicate traumatic bursitis of the subacromial space. -Counseled on home exercise therapy and supportive care. -Injection. -X-ray. -Could consider glenohumeral injection or physical therapy.

## 2019-09-24 NOTE — Patient Instructions (Signed)
Nice to meet you Please try ice  Please try the exercises  I will call you with the results from today   Please send me a message in MyChart with any questions or updates.  Please see me back in 4 weeks or sooner if needed.   --Dr. Raeford Razor

## 2019-09-26 ENCOUNTER — Telehealth: Payer: Self-pay | Admitting: Family Medicine

## 2019-09-26 NOTE — Telephone Encounter (Signed)
Informed of results.   Rosemarie Ax, MD [facility] 09/26/2019, 9:07 AM

## 2019-10-09 ENCOUNTER — Telehealth: Payer: Self-pay | Admitting: Family Medicine

## 2019-10-09 DIAGNOSIS — R05 Cough: Secondary | ICD-10-CM | POA: Diagnosis not present

## 2019-10-09 NOTE — Telephone Encounter (Signed)
Pt request call from provider with recommendation for PT  --pls call him @ [phone]  --glh

## 2019-10-10 ENCOUNTER — Telehealth: Payer: Self-pay | Admitting: Family Medicine

## 2019-10-10 NOTE — Telephone Encounter (Signed)
Pt called state he played golf yesterday & discovered ROM has improved but he is still experiencing shoulder pain & wishes to discuss poss PT  With provider.  --Pt's ph# [phone]  --Forwarding message to Dr.Schmitz  --glh

## 2019-10-10 NOTE — Telephone Encounter (Signed)
Left VM for patient. If he calls back please have him speak with a nurse/CMA and inform we can put in a referral to physical therapy here in the building. .   If any questions then please take the best time and phone number to call and I will try to call him back.   Rosemarie Ax, MD [facility] 10/10/2019, 8:18 AM

## 2019-10-10 NOTE — Telephone Encounter (Signed)
Left VM for patient. If he calls back please have him speak with a nurse/CMA and inform that physical therapy can help with some pain. An injection into the joint would be an option to help with the pain as well. .   If any questions then please take the best time and phone number to call and I will try to call him back.   Rosemarie Ax, MD [facility] 10/10/2019, 10:52 AM

## 2019-10-10 NOTE — Addendum Note (Signed)
Addended by: Sherrie George F on: 10/10/2019 08:30 AM   Modules accepted: Orders

## 2019-10-21 ENCOUNTER — Ambulatory Visit: Payer: PPO | Attending: Family Medicine | Admitting: Physical Therapy

## 2019-10-21 ENCOUNTER — Encounter: Payer: Self-pay | Admitting: Family Medicine

## 2019-10-21 ENCOUNTER — Other Ambulatory Visit: Payer: Self-pay

## 2019-10-21 ENCOUNTER — Encounter: Payer: Self-pay | Admitting: Physical Therapy

## 2019-10-21 ENCOUNTER — Ambulatory Visit: Payer: PPO | Admitting: Family Medicine

## 2019-10-21 DIAGNOSIS — M25512 Pain in left shoulder: Secondary | ICD-10-CM

## 2019-10-21 DIAGNOSIS — M25612 Stiffness of left shoulder, not elsewhere classified: Secondary | ICD-10-CM | POA: Diagnosis not present

## 2019-10-21 DIAGNOSIS — R293 Abnormal posture: Secondary | ICD-10-CM

## 2019-10-21 DIAGNOSIS — M6281 Muscle weakness (generalized): Secondary | ICD-10-CM

## 2019-10-21 NOTE — Therapy (Signed)
[facility] [address]  [place], [place], [address] Phone: [phone]   Fax:  [phone]  Physical Therapy Evaluation  Patient Details  Name: Jordan Lee MRN: [id number] Date of Birth: 1945/12/02 Referring Provider (PT): Clearance Coots, MD    Encounter Date: 10/21/2019   PT End of Session - 10/21/19 0839    Visit Number 1    Number of Visits 7    Date for PT Re-Evaluation 12/02/19    Authorization Type [employer]    PT Start Time 0801    PT Stop Time 0834    PT Time Calculation (min) 33 min    Activity Tolerance Patient tolerated treatment well;Patient limited by pain    Behavior During Therapy [facility] for tasks assessed/performed           Past Medical History:  Diagnosis Date  . Cataract    Right eye  . Diabetes mellitus without complication ([place])   . Hypertension     Past Surgical History:  Procedure Laterality Date  . HERNIA REPAIR     Inguinal  . ROTATOR CUFF REPAIR      There were no vitals filed for this visit.    Subjective Assessment - 10/21/19 0803    Subjective Patient reports L shoulder pain since late June when he fell with his L elbow being the point of contact on the floor. Denies pain in the elbow, but shoulder pain occurs over the lateral and posterior aspect. Denies radiation or N/T. Worse with R sidelying and sudden movements. However, notes that he was able to play golf 3 times since the injury with minimal pain. Better with ice, heat, and possibly injection.    Pertinent History HTN, DM, R RTC repair    Limitations Lifting;House hold activities    Diagnostic tests 09/24/19 L shoulder xray: No fracture or dislocation.  No appreciable arthropathy.    Patient Stated Goals "eliminate the pain"    Currently in Pain? Yes    Pain Score 0-No pain    Pain Location Shoulder    Pain Orientation Left;Lateral    Pain Descriptors / Indicators Dull    Pain Type Acute pain               [facility] PT Assessment - 10/21/19 0808      Assessment   Medical Diagnosis Acute pain of L shoulder    Referring Provider (PT) Clearance Coots, MD    Onset Date/Surgical Date 09/20/19    Hand Dominance Right    Next MD Visit 10/21/19    Prior Therapy no      Precautions   Precautions None      Balance Screen   Has the patient fallen in the past 6 months Yes    How many times? 1   fall causing current injury   Has the patient had a decrease in activity level because of a fear of falling?  No    Is the patient reluctant to leave their home because of a fear of falling?  No      Home Social worker Private residence    Living Arrangements Spouse/significant other    Available Help at Discharge Family;Friend(s)    Type of Home House      Prior Function   Level of [place] Retired    Leisure golf      Cognition   Overall Cognitive Status Within Functional Limits  for tasks assessed      Sensation   Light Touch Appears Intact      Coordination   Gross Motor Movements are Fluid and Coordinated Yes      Posture/Postural Control   Posture/Postural Control Postural limitations    Postural Limitations Rounded Shoulders;Forward head      ROM / Strength   AROM / PROM / Strength AROM;Strength      AROM   AROM Assessment Site Shoulder    Right/Left Shoulder Right;Left    Right Shoulder Flexion 135 Degrees    Right Shoulder ABduction 157 Degrees    Right Shoulder Internal Rotation --   FIR T7    Right Shoulder External Rotation --   FER T1   Left Shoulder Flexion 133 Degrees   mild pain   Left Shoulder ABduction 148 Degrees   mild pain   Left Shoulder Internal Rotation --   FIR T7 & mild pain   Left Shoulder External Rotation --   FER C7 & c/o difficulty     Strength   Strength Assessment Site Shoulder    Right/Left Shoulder Right;Left    Right Shoulder Flexion 4/5    Right Shoulder ABduction 4/5   nonpainful pop    Right Shoulder Internal Rotation 4+/5    Right Shoulder External Rotation 4+/5    Left Shoulder Flexion 4/5    Left Shoulder ABduction 4+/5    Left Shoulder Internal Rotation 4-/5   moderate pain   Left Shoulder External Rotation 4-/5   moderate pain     Palpation   Palpation comment mildly TTP over L scalenes and proximal biceps tendon                      Objective measurements completed on examination: See above findings.               PT Education - 10/21/19 0839    Education Details prognosis, POC, HEP    Person(s) Educated Patient    Methods Explanation;Demonstration;Tactile cues;Verbal cues;Handout    Comprehension Verbalized understanding;Returned demonstration            PT Short Term Goals - 10/21/19 0843      PT SHORT TERM GOAL #1   Title Patient to be indepenendent with initial HEP.    Time 3    Period Weeks    Status New    Target Date 11/11/19             PT Long Term Goals - 10/21/19 0843      PT LONG TERM GOAL #1   Title Patient to be indepenendent with advanced HEP.    Time 6    Period Weeks    Status New    Target Date 12/02/19      PT LONG TERM GOAL #2   Title Patient to demonstrate L shoulder AROM WFL and without pain limiting.    Time 6    Period Weeks    Status New    Target Date 12/02/19      PT LONG TERM GOAL #3   Title Patient to demonstrate L shoulder strength >/=4+/5.    Time 6    Period Weeks    Status New    Target Date 12/02/19      PT LONG TERM GOAL #4   Title Patient to report 2 hours of R sidelying without L shoulder pain limiting.    Time 6    Period Weeks  Status New    Target Date 12/02/19                  Plan - 10/21/19 0839    Clinical Impression Statement Patient is a 74y/o M presenting to O[facility] with c/o L shoulder pain of 1 month duration after a fall. Shoulder pain occurs over the L posterolateral aspect of the shoulder; worse with R sidelying and sudden movements. Denies  radiation or N/T. Denies fear of falling or trouble with balance. Patient today presenting with rounded and forward head posture, limited and painful L shoulder AROM, decreased B shoulder strength, and mild TTP over L scalenes and proximal biceps tendon. Patient was educated on postural correction and periscapular strengthening HEP- patient reported understanding. Would benefit from skilled PT services 1x/week for 6 weeks to address aforementioned impairments.    Personal Factors and Comorbidities Age;Sex;Comorbidity 3+;Past/Current Experience;Time since onset of injury/illness/exacerbation    Comorbidities HTN, DM, R RTC repair    Examination-Activity Limitations Sleep;Bed Mobility;Carry;Caring for Others;Dressing;Hygiene/Grooming;Lift;Reach Overhead    Examination-Participation Restrictions Cleaning;Shop;Community Activity;Volunteer;Driving;Yard Work;Laundry;Meal Prep    Stability/Clinical Decision Making Stable/Uncomplicated    Clinical Decision Making Low    Rehab Potential Good    PT Frequency 1x / week    PT Duration 6 weeks    PT Treatment/Interventions ADLs/Self Care Home Management;Cryotherapy;Electrical Stimulation;Iontophoresis 4mg /ml Dexamethasone;Moist Heat;Therapeutic exercise;Balance training;Therapeutic activities;Functional mobility training;Ultrasound;Neuromuscular re-education;Patient/family education;Manual techniques;Vasopneumatic Device;Taping;Energy conservation;Dry needling;Passive range of motion    PT Next Visit Plan shoulder FOTO, progress L shoulder AAROM/AROM and RTC and periscapular strengthening    Consulted and Agree with Plan of Care Patient           Patient will benefit from skilled therapeutic intervention in order to improve the following deficits and impairments:  Increased edema, Decreased activity tolerance, Decreased strength, Pain, Impaired UE functional use, Increased fascial restricitons, Increased muscle spasms, Improper body mechanics, Decreased range  of motion, Postural dysfunction, Impaired flexibility  Visit Diagnosis: Acute pain of left shoulder  Stiffness of left shoulder, not elsewhere classified  Muscle weakness (generalized)  Abnormal posture     Problem List Patient Active Problem List   Diagnosis Date Noted  . Acute pain of left shoulder 09/24/2019     Janene Harvey, PT, DPT 10/21/19 8:47 AM   [facility] [address]  [place], [place], [address] Phone: [phone]   Fax:  [phone]  Name: Trip Cavanagh MRN: [id number] Date of Birth: 1946-01-18

## 2019-10-21 NOTE — Assessment & Plan Note (Signed)
Has been doing well since his subacromial injection.  His range of motion is good.  He is able to play golf with minimal to no pain. -Counseled on home exercise therapy and supportive care. -Continue physical therapy. -Can continue using Voltaren. -Can follow-up as needed.  Could consider glenohumeral injection if needed.

## 2019-10-21 NOTE — Progress Notes (Signed)
Jordan Lee - 74 y.o. male MRN [id number]  Date of birth: Aug 16, 1945  SUBJECTIVE:  Including CC & ROS.  Chief Complaint  Patient presents with  . Follow-up    left shoulder    Jordan Lee is a 74 y.o. male that is following up for his left shoulder pain.  Has been doing well since the injection.  Started physical therapy today.  He has been able to play golf with minimal to no pain.  Denies any radicular symptoms.   Review of Systems See HPI   HISTORY: Past Medical, Surgical, Social, and Family History Reviewed & Updated per EMR.   Pertinent Historical Findings include:  Past Medical History:  Diagnosis Date  . Cataract    Right eye  . Diabetes mellitus without complication ([place])   . Hypertension     Past Surgical History:  Procedure Laterality Date  . HERNIA REPAIR     Inguinal  . ROTATOR CUFF REPAIR      Family History  Problem Relation Age of Onset  . COPD Mother   . Hypertension Mother   . Cancer Father        Lung cancer  . Cancer Sister        Pancreatic cancer    Social History   Socioeconomic History  . Marital status: Married    Spouse name: Not on file  . Number of children: Not on file  . Years of education: Not on file  . Highest education level: Not on file  Occupational History  . Not on file  Tobacco Use  . Smoking status: Never Smoker  . Smokeless tobacco: Never Used  Vaping Use  . Vaping Use: Never used  Substance and Sexual Activity  . Alcohol use: No  . Drug use: No  . Sexual activity: Not on file  Other Topics Concern  . Not on file  Social History Narrative  . Not on file   Social Determinants of Health   Financial Resource Strain:   . Difficulty of Paying Living Expenses:   Food Insecurity:   . Worried About Charity fundraiser in the Last Year:   . Arboriculturist in the Last Year:   Transportation Needs:   . Film/video editor (Medical):   Marland Kitchen Lack of Transportation (Non-Medical):   Physical Activity:   . Days  of Exercise per Week:   . Minutes of Exercise per Session:   Stress:   . Feeling of Stress :   Social Connections:   . Frequency of Communication with Friends and Family:   . Frequency of Social Gatherings with Friends and Family:   . Attends Religious Services:   . Active Member of Clubs or Organizations:   . Attends Archivist Meetings:   Marland Kitchen Marital Status:   Intimate Partner Violence:   . Fear of Current or Ex-Partner:   . Emotionally Abused:   Marland Kitchen Physically Abused:   . Sexually Abused:      PHYSICAL EXAM:  VS: BP (!) 145/85   Pulse 82   Ht 6\' 2"  (1.88 m)   Wt 200 lb (90.7 kg)   BMI 25.68 kg/m  Physical Exam Gen: NAD, alert, cooperative with exam, well-appearing MSK:  Left shoulder: Normal abduction and flexion. Normal internal/external rotation. Neurovascularly intact     ASSESSMENT & PLAN:   Acute pain of left shoulder Has been doing well since his subacromial injection.  His range of motion is good.  He is able to  play golf with minimal to no pain. -Counseled on home exercise therapy and supportive care. -Continue physical therapy. -Can continue using Voltaren. -Can follow-up as needed.  Could consider glenohumeral injection if needed.

## 2019-10-30 ENCOUNTER — Other Ambulatory Visit: Payer: Self-pay

## 2019-10-30 ENCOUNTER — Encounter: Payer: Self-pay | Admitting: Physical Therapy

## 2019-10-30 ENCOUNTER — Ambulatory Visit: Payer: PPO | Attending: Family Medicine | Admitting: Physical Therapy

## 2019-10-30 DIAGNOSIS — M6281 Muscle weakness (generalized): Secondary | ICD-10-CM | POA: Diagnosis not present

## 2019-10-30 DIAGNOSIS — R293 Abnormal posture: Secondary | ICD-10-CM

## 2019-10-30 DIAGNOSIS — M25512 Pain in left shoulder: Secondary | ICD-10-CM | POA: Diagnosis not present

## 2019-10-30 DIAGNOSIS — M25612 Stiffness of left shoulder, not elsewhere classified: Secondary | ICD-10-CM | POA: Insufficient documentation

## 2019-10-30 NOTE — Therapy (Addendum)
[facility] [address]  [place], [place], [address] Phone: [phone]   Fax:  [phone]  Physical Therapy Treatment  Patient Details  Name: Jordan Lee MRN: [id number] Date of Birth: 1945-11-01 Referring Provider (PT): Clearance Coots, MD   Progress Note Reporting Period 10/21/19 to 10/30/19  See note below for Objective Data and Assessment of Progress/Goals.     Encounter Date: 10/30/2019   PT End of Session - 10/30/19 0928    Visit Number 2    Number of Visits 7    Date for PT Re-Evaluation 12/02/19    Authorization Type [employer]    PT Start Time 0848    PT Stop Time 0927    PT Time Calculation (min) 39 min    Activity Tolerance Patient tolerated treatment well    Behavior During Therapy [facility] for tasks assessed/performed           Past Medical History:  Diagnosis Date  . Cataract    Right eye  . Diabetes mellitus without complication ([place])   . Hypertension     Past Surgical History:  Procedure Laterality Date  . HERNIA REPAIR     Inguinal  . ROTATOR CUFF REPAIR      There were no vitals filed for this visit.   Subjective Assessment - 10/30/19 0849    Subjective Has been working on HEP and has played golf but does not see much improvement in pain.    Pertinent History HTN, DM, R RTC repair    Diagnostic tests 09/24/19 L shoulder xray: No fracture or dislocation.  No appreciable arthropathy.    Patient Stated Goals "eliminate the pain"    Currently in Pain? No/denies              [facility] PT Assessment - 10/30/19 0001      Observation/Other Assessments   Focus on Therapeutic Outcomes (FOTO)  Shoulder: 65 (35% limited, 29% PREDICTED)                         [facility] Adult PT Treatment/Exercise - 10/30/19 0001      Exercises   Exercises Shoulder      Shoulder Exercises: Seated   Retraction Strengthening;Both;5 reps    Retraction Limitations 5x5"    External  Rotation AAROM;Left;10 reps    External Rotation Limitations with wand to tolerance    Internal Rotation AAROM;Left;10 reps    Internal Rotation Limitations with wand to tolerance      Shoulder Exercises: Sidelying   ABduction Strengthening;Left;10 reps    ABduction Weight (lbs) 1    ABduction Limitations thumb up; cues for controlled movement   1st rep with nonpainful pop in L shoulder     Shoulder Exercises: Standing   External Rotation Strengthening;Left;10 reps;Theraband    Theraband Level (Shoulder External Rotation) Level 1 (Yellow)    External Rotation Limitations isometric stepouts   cues to maintain thumb up and correct form   Internal Rotation Strengthening;Left;10 reps;Theraband    Internal Rotation Limitations isometric stepouts   cues to maintain thumb up and correct form   Extension Strengthening;Both;10 reps;Theraband    Extension Limitations cues for scap retraction    Row Strengthening;Both;10 reps;Theraband    Row Limitations cueing to avoid shoulder hike & promote retraction      Shoulder Exercises: ROM/Strengthening   UBE (Upper Arm Bike) L1 X 83mn forward/3 min back      Shoulder Exercises:  Stretch   Corner Stretch 2 reps;30 seconds    Corner Stretch Limitations cues to avoid pushing into pain                  PT Education - 10/30/19 0928    Education Details update to HEP    Person(s) Educated Patient    Methods Explanation;Demonstration;Tactile cues;Verbal cues;Handout    Comprehension Verbalized understanding;Returned demonstration            PT Short Term Goals - 10/30/19 1059      PT SHORT TERM GOAL #1   Title Patient to be indepenendent with initial HEP.    Time 3    Period Weeks    Status On-going    Target Date 11/11/19             PT Long Term Goals - 10/30/19 1059      PT LONG TERM GOAL #1   Title Patient to be indepenendent with advanced HEP.    Time 6    Period Weeks    Status On-going      PT LONG TERM GOAL #2    Title Patient to demonstrate L shoulder AROM WFL and without pain limiting.    Time 6    Period Weeks    Status On-going      PT LONG TERM GOAL #3   Title Patient to demonstrate L shoulder strength >/=4+/5.    Time 6    Period Weeks    Status On-going      PT LONG TERM GOAL #4   Title Patient to report 2 hours of R sidelying without L shoulder pain limiting.    Time 6    Period Weeks    Status On-going                 Plan - 10/30/19 7829    Clinical Impression Statement Patient without new complaints this AM. Reviewed initial HEP with intermittent cueing for proper form. Patient requiring correction of alignment and positioning with isometric shoulder step outs. Initiated rows with cues to correct shoulder elevation and promote retraction. Patient also continued to struggle with carryover of scapular retraction with resisted shoulder extension. Updated these exercises into HEP for continued practice at home- patient reported understanding. Did demonstrate difficulty controlling sidelying abduction with light weight, requiring cues to decrease speed. Patient reported mild discomfort in L shoulder after this exercise, but declined modalities at end of session.    Comorbidities HTN, DM, R RTC repair    PT Treatment/Interventions ADLs/Self Care Home Management;Cryotherapy;Electrical Stimulation;Iontophoresis 33m/ml Dexamethasone;Moist Heat;Therapeutic exercise;Balance training;Therapeutic activities;Functional mobility training;Ultrasound;Neuromuscular re-education;Patient/family education;Manual techniques;Vasopneumatic Device;Taping;Energy conservation;Dry needling;Passive range of motion    PT Next Visit Plan progress L shoulder AAROM/AROM and RTC and periscapular strengthening    Consulted and Agree with Plan of Care Patient           Patient will benefit from skilled therapeutic intervention in order to improve the following deficits and impairments:  Increased edema, Decreased  activity tolerance, Decreased strength, Pain, Impaired UE functional use, Increased fascial restricitons, Increased muscle spasms, Improper body mechanics, Decreased range of motion, Postural dysfunction, Impaired flexibility  Visit Diagnosis: Acute pain of left shoulder  Stiffness of left shoulder, not elsewhere classified  Muscle weakness (generalized)  Abnormal posture     Problem List Patient Active Problem List   Diagnosis Date Noted  . Acute pain of left shoulder 09/24/2019    YJanene Harvey PT, DPT 10/30/19 11:00 AM   [facility] [address]  [place], [place], [address] Phone: [phone]   Fax:  [phone]  Name: Jordan Lee MRN: [id number] Date of Birth: Oct 05, 1945  PHYSICAL THERAPY DISCHARGE SUMMARY  Visits from Start of Care: 2  Current functional level related to goals / functional outcomes: Unable to assess; patient did not return   Remaining deficits: Unable to assess   Education / Equipment: HEP  Plan: Patient agrees to discharge.  Patient goals were not met. Patient is being discharged due to not returning since the last visit.  ?????     Janene Harvey, PT, DPT 11/25/19 1:30 PM

## 2019-11-06 ENCOUNTER — Ambulatory Visit: Payer: PPO | Admitting: Physical Therapy

## 2019-11-08 DIAGNOSIS — M25512 Pain in left shoulder: Secondary | ICD-10-CM | POA: Diagnosis not present

## 2019-11-08 DIAGNOSIS — M7582 Other shoulder lesions, left shoulder: Secondary | ICD-10-CM | POA: Diagnosis not present

## 2019-11-13 ENCOUNTER — Ambulatory Visit: Payer: PPO

## 2019-11-20 ENCOUNTER — Encounter: Payer: PPO | Admitting: Physical Therapy

## 2019-11-29 DIAGNOSIS — H6123 Impacted cerumen, bilateral: Secondary | ICD-10-CM | POA: Diagnosis not present

## 2020-01-21 ENCOUNTER — Other Ambulatory Visit (HOSPITAL_BASED_OUTPATIENT_CLINIC_OR_DEPARTMENT_OTHER): Payer: Self-pay | Admitting: Internal Medicine

## 2020-01-21 ENCOUNTER — Ambulatory Visit: Payer: PPO | Attending: Internal Medicine

## 2020-01-21 DIAGNOSIS — Z23 Encounter for immunization: Secondary | ICD-10-CM

## 2020-01-21 NOTE — Progress Notes (Signed)
   Covid-19 Vaccination Clinic  Name:  Jordan Lee    MRN: [id number] DOB: 1946/02/01  01/21/2020  Jordan Lee was observed post Covid-19 immunization for 15 minutes without incident. He was provided with Vaccine Information Sheet and instruction to access the V-Safe system. Vaccinated by [employer].  Jordan Lee was instructed to call 911 with any severe reactions post vaccine: Marland Kitchen Difficulty breathing  . Swelling of face and throat  . A fast heartbeat  . A bad rash all over body  . Dizziness and weakness

## 2020-01-28 MED FILL — PFIZER-BIONTECH COVID-19 VA: 30 | 1 days supply | Qty: 0 | Fill #0

## 2020-01-31 DIAGNOSIS — Z20822 Contact with and (suspected) exposure to covid-19: Secondary | ICD-10-CM | POA: Diagnosis not present

## 2020-03-02 DIAGNOSIS — R053 Chronic cough: Secondary | ICD-10-CM | POA: Diagnosis not present

## 2020-03-02 DIAGNOSIS — J9811 Atelectasis: Secondary | ICD-10-CM | POA: Diagnosis not present

## 2020-03-10 DIAGNOSIS — H02831 Dermatochalasis of right upper eyelid: Secondary | ICD-10-CM | POA: Diagnosis not present

## 2020-03-10 DIAGNOSIS — H25013 Cortical age-related cataract, bilateral: Secondary | ICD-10-CM | POA: Diagnosis not present

## 2020-03-10 DIAGNOSIS — H5213 Myopia, bilateral: Secondary | ICD-10-CM | POA: Diagnosis not present

## 2020-03-10 DIAGNOSIS — E119 Type 2 diabetes mellitus without complications: Secondary | ICD-10-CM | POA: Diagnosis not present

## 2020-03-10 DIAGNOSIS — Z83511 Family history of glaucoma: Secondary | ICD-10-CM | POA: Diagnosis not present

## 2020-03-10 DIAGNOSIS — Z7984 Long term (current) use of oral hypoglycemic drugs: Secondary | ICD-10-CM | POA: Diagnosis not present

## 2020-03-10 DIAGNOSIS — H43393 Other vitreous opacities, bilateral: Secondary | ICD-10-CM | POA: Diagnosis not present

## 2020-03-10 DIAGNOSIS — H524 Presbyopia: Secondary | ICD-10-CM | POA: Diagnosis not present

## 2020-03-10 DIAGNOSIS — H02834 Dermatochalasis of left upper eyelid: Secondary | ICD-10-CM | POA: Diagnosis not present

## 2020-03-10 DIAGNOSIS — H2513 Age-related nuclear cataract, bilateral: Secondary | ICD-10-CM | POA: Diagnosis not present

## 2020-03-10 DIAGNOSIS — H52203 Unspecified astigmatism, bilateral: Secondary | ICD-10-CM | POA: Diagnosis not present

## 2020-03-13 DIAGNOSIS — R053 Chronic cough: Secondary | ICD-10-CM | POA: Diagnosis not present

## 2020-03-26 DIAGNOSIS — R809 Proteinuria, unspecified: Secondary | ICD-10-CM | POA: Diagnosis not present

## 2020-03-26 DIAGNOSIS — E1159 Type 2 diabetes mellitus with other circulatory complications: Secondary | ICD-10-CM | POA: Diagnosis not present

## 2020-03-26 DIAGNOSIS — N5201 Erectile dysfunction due to arterial insufficiency: Secondary | ICD-10-CM | POA: Diagnosis not present

## 2020-03-26 DIAGNOSIS — M79641 Pain in right hand: Secondary | ICD-10-CM | POA: Diagnosis not present

## 2020-03-26 DIAGNOSIS — I77811 Abdominal aortic ectasia: Secondary | ICD-10-CM | POA: Diagnosis not present

## 2020-03-26 DIAGNOSIS — E1129 Type 2 diabetes mellitus with other diabetic kidney complication: Secondary | ICD-10-CM | POA: Diagnosis not present

## 2020-03-26 DIAGNOSIS — I152 Hypertension secondary to endocrine disorders: Secondary | ICD-10-CM | POA: Diagnosis not present

## 2020-03-26 DIAGNOSIS — E785 Hyperlipidemia, unspecified: Secondary | ICD-10-CM | POA: Diagnosis not present

## 2020-04-02 DIAGNOSIS — E785 Hyperlipidemia, unspecified: Secondary | ICD-10-CM | POA: Diagnosis not present

## 2020-04-02 DIAGNOSIS — E1129 Type 2 diabetes mellitus with other diabetic kidney complication: Secondary | ICD-10-CM | POA: Diagnosis not present

## 2020-04-02 DIAGNOSIS — I152 Hypertension secondary to endocrine disorders: Secondary | ICD-10-CM | POA: Diagnosis not present

## 2020-04-02 DIAGNOSIS — R809 Proteinuria, unspecified: Secondary | ICD-10-CM | POA: Diagnosis not present

## 2020-04-02 DIAGNOSIS — E1159 Type 2 diabetes mellitus with other circulatory complications: Secondary | ICD-10-CM | POA: Diagnosis not present

## 2020-04-06 DIAGNOSIS — M79641 Pain in right hand: Secondary | ICD-10-CM | POA: Diagnosis not present

## 2020-09-10 DIAGNOSIS — B9689 Other specified bacterial agents as the cause of diseases classified elsewhere: Secondary | ICD-10-CM | POA: Diagnosis not present

## 2020-09-10 DIAGNOSIS — J208 Acute bronchitis due to other specified organisms: Secondary | ICD-10-CM | POA: Diagnosis not present

## 2020-09-15 DIAGNOSIS — H52203 Unspecified astigmatism, bilateral: Secondary | ICD-10-CM | POA: Diagnosis not present

## 2020-09-15 DIAGNOSIS — H02834 Dermatochalasis of left upper eyelid: Secondary | ICD-10-CM | POA: Diagnosis not present

## 2020-09-15 DIAGNOSIS — E119 Type 2 diabetes mellitus without complications: Secondary | ICD-10-CM | POA: Diagnosis not present

## 2020-09-15 DIAGNOSIS — H2513 Age-related nuclear cataract, bilateral: Secondary | ICD-10-CM | POA: Diagnosis not present

## 2020-09-15 DIAGNOSIS — H5213 Myopia, bilateral: Secondary | ICD-10-CM | POA: Diagnosis not present

## 2020-09-15 DIAGNOSIS — H02831 Dermatochalasis of right upper eyelid: Secondary | ICD-10-CM | POA: Diagnosis not present

## 2020-09-15 DIAGNOSIS — H43393 Other vitreous opacities, bilateral: Secondary | ICD-10-CM | POA: Diagnosis not present

## 2020-09-15 DIAGNOSIS — H25013 Cortical age-related cataract, bilateral: Secondary | ICD-10-CM | POA: Diagnosis not present

## 2020-09-15 DIAGNOSIS — H524 Presbyopia: Secondary | ICD-10-CM | POA: Diagnosis not present

## 2020-09-15 DIAGNOSIS — Z7984 Long term (current) use of oral hypoglycemic drugs: Secondary | ICD-10-CM | POA: Diagnosis not present

## 2020-09-18 DIAGNOSIS — R053 Chronic cough: Secondary | ICD-10-CM | POA: Diagnosis not present

## 2020-09-18 DIAGNOSIS — E1129 Type 2 diabetes mellitus with other diabetic kidney complication: Secondary | ICD-10-CM | POA: Diagnosis not present

## 2020-09-18 DIAGNOSIS — R809 Proteinuria, unspecified: Secondary | ICD-10-CM | POA: Diagnosis not present

## 2020-09-18 DIAGNOSIS — I152 Hypertension secondary to endocrine disorders: Secondary | ICD-10-CM | POA: Diagnosis not present

## 2020-09-18 DIAGNOSIS — E1159 Type 2 diabetes mellitus with other circulatory complications: Secondary | ICD-10-CM | POA: Diagnosis not present

## 2020-10-14 IMAGING — CR DG SHOULDER 2+V*L*
3 series · 3 of 3 positions shown · non-contrast
Comparison: September 22, 2019

CLINICAL DATA: Pain

EXAM:
LEFT SHOULDER - 2+ VIEW

[w shoulder grashey left]
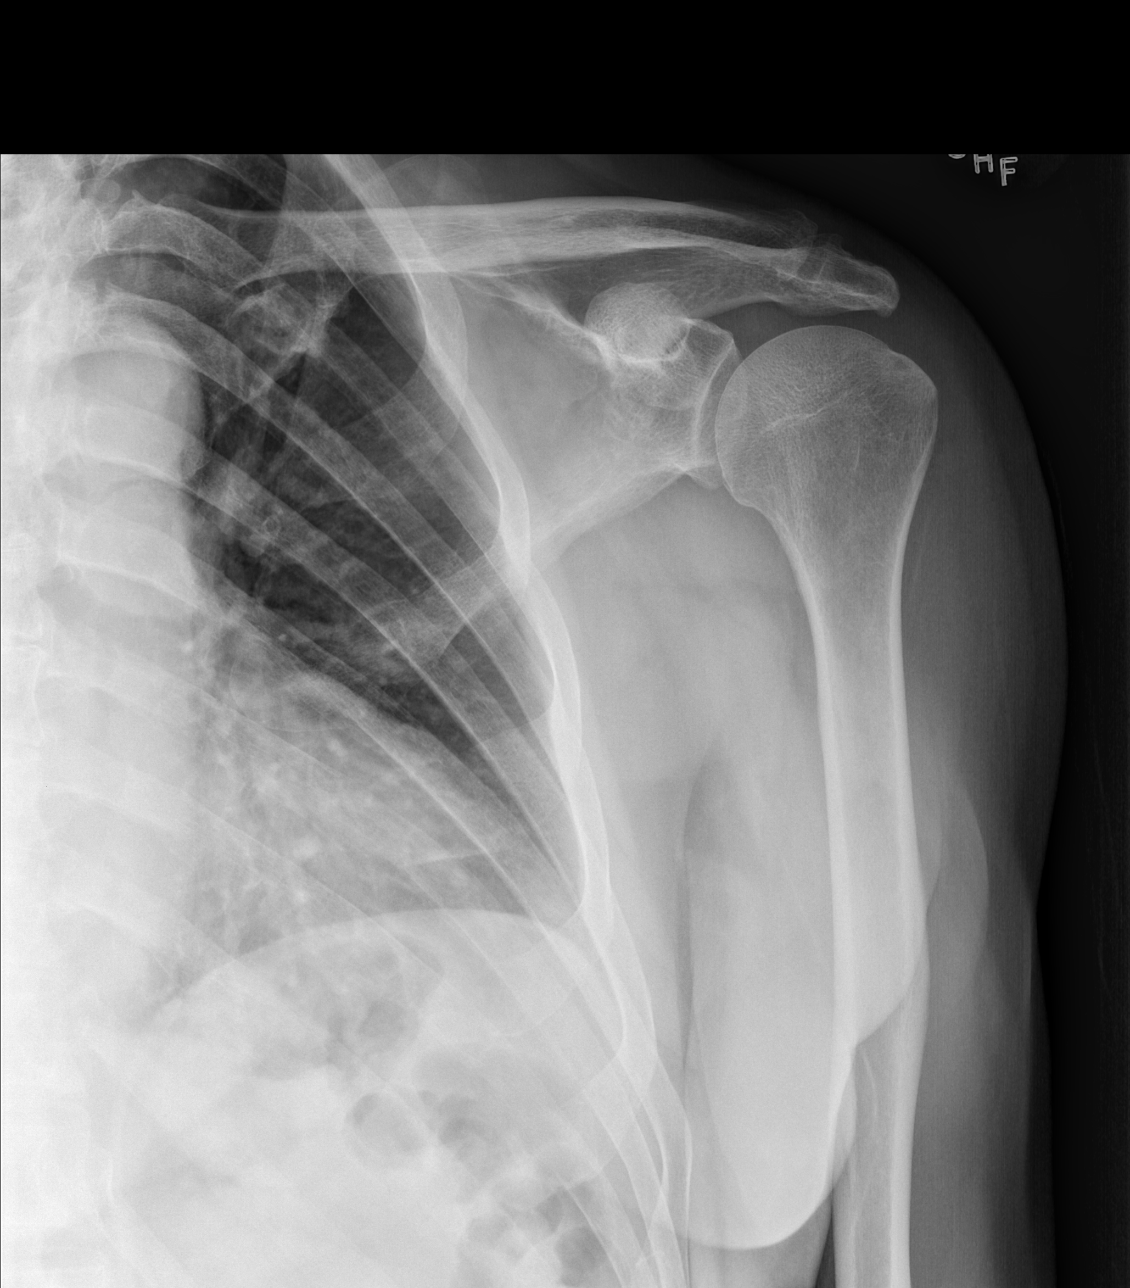

[w shoulder y view left]
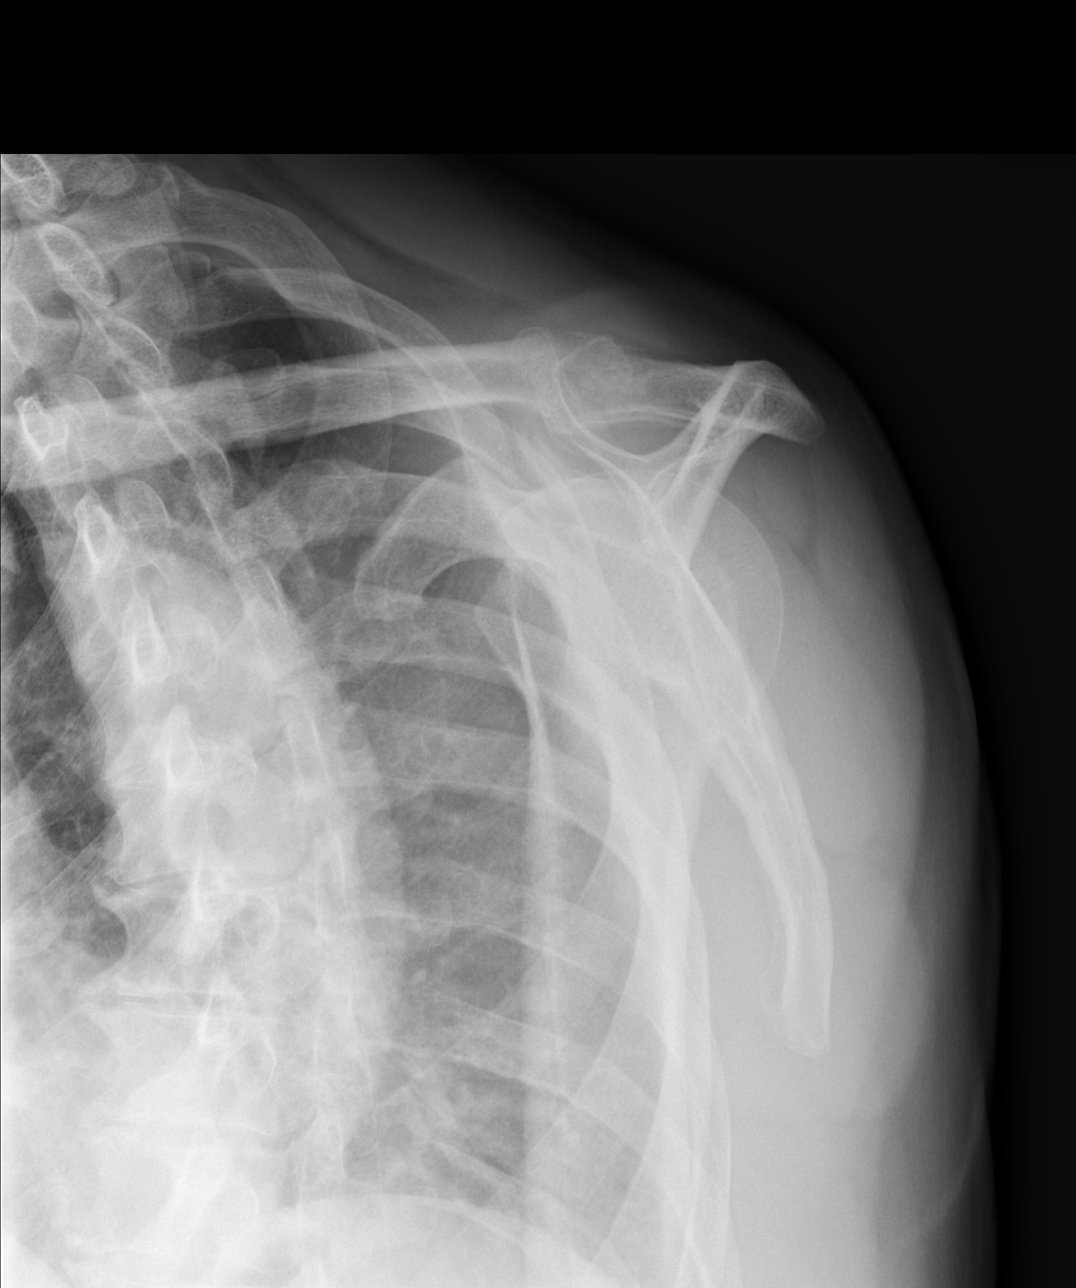

[x shoulder axillary left]
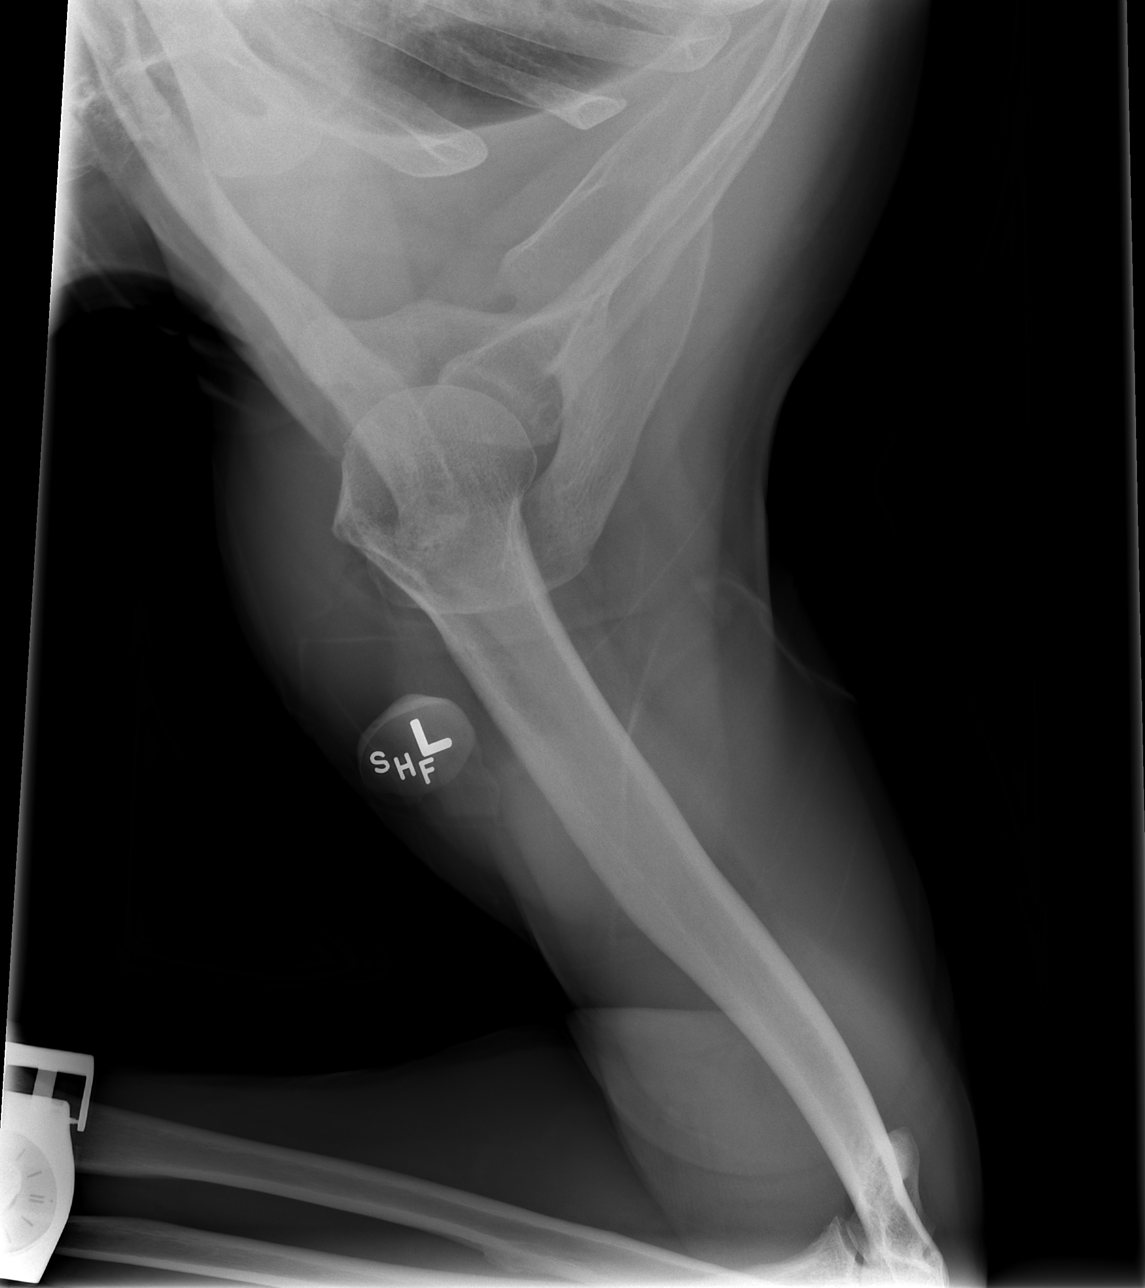

[3 of 3 positions shown; findings below may reference images not displayed]

FINDINGS: Oblique, Y scapular, and axillary images were obtained. No fracture
or dislocation. No appreciable joint space narrowing or erosion.
Visualized left lung clear.
IMPRESSION: No fracture or dislocation.  No appreciable arthropathy.

## 2020-10-21 DIAGNOSIS — E1159 Type 2 diabetes mellitus with other circulatory complications: Secondary | ICD-10-CM | POA: Diagnosis not present

## 2020-10-21 DIAGNOSIS — R809 Proteinuria, unspecified: Secondary | ICD-10-CM | POA: Diagnosis not present

## 2020-10-21 DIAGNOSIS — E1129 Type 2 diabetes mellitus with other diabetic kidney complication: Secondary | ICD-10-CM | POA: Diagnosis not present

## 2020-10-21 DIAGNOSIS — I152 Hypertension secondary to endocrine disorders: Secondary | ICD-10-CM | POA: Diagnosis not present

## 2020-10-21 DIAGNOSIS — E785 Hyperlipidemia, unspecified: Secondary | ICD-10-CM | POA: Diagnosis not present

## 2020-10-21 DIAGNOSIS — Z125 Encounter for screening for malignant neoplasm of prostate: Secondary | ICD-10-CM | POA: Diagnosis not present

## 2020-10-25 DIAGNOSIS — T63421A Toxic effect of venom of ants, accidental (unintentional), initial encounter: Secondary | ICD-10-CM | POA: Diagnosis not present

## 2020-12-28 DIAGNOSIS — E1129 Type 2 diabetes mellitus with other diabetic kidney complication: Secondary | ICD-10-CM | POA: Diagnosis not present

## 2020-12-28 DIAGNOSIS — I77811 Abdominal aortic ectasia: Secondary | ICD-10-CM | POA: Diagnosis not present

## 2020-12-28 DIAGNOSIS — N5201 Erectile dysfunction due to arterial insufficiency: Secondary | ICD-10-CM | POA: Diagnosis not present

## 2020-12-28 DIAGNOSIS — D126 Benign neoplasm of colon, unspecified: Secondary | ICD-10-CM | POA: Diagnosis not present

## 2020-12-28 DIAGNOSIS — J3089 Other allergic rhinitis: Secondary | ICD-10-CM | POA: Diagnosis not present

## 2020-12-28 DIAGNOSIS — R809 Proteinuria, unspecified: Secondary | ICD-10-CM | POA: Diagnosis not present

## 2020-12-28 DIAGNOSIS — E1159 Type 2 diabetes mellitus with other circulatory complications: Secondary | ICD-10-CM | POA: Diagnosis not present

## 2020-12-28 DIAGNOSIS — I152 Hypertension secondary to endocrine disorders: Secondary | ICD-10-CM | POA: Diagnosis not present

## 2021-01-08 DIAGNOSIS — J029 Acute pharyngitis, unspecified: Secondary | ICD-10-CM | POA: Diagnosis not present

## 2021-01-11 DIAGNOSIS — J40 Bronchitis, not specified as acute or chronic: Secondary | ICD-10-CM | POA: Diagnosis not present

## 2021-01-11 DIAGNOSIS — R059 Cough, unspecified: Secondary | ICD-10-CM | POA: Diagnosis not present

## 2021-01-12 DIAGNOSIS — R0982 Postnasal drip: Secondary | ICD-10-CM | POA: Diagnosis not present

## 2021-01-12 DIAGNOSIS — R051 Acute cough: Secondary | ICD-10-CM | POA: Diagnosis not present

## 2021-01-12 DIAGNOSIS — J069 Acute upper respiratory infection, unspecified: Secondary | ICD-10-CM | POA: Diagnosis not present

## 2021-01-20 DIAGNOSIS — M25521 Pain in right elbow: Secondary | ICD-10-CM | POA: Diagnosis not present

## 2021-01-20 DIAGNOSIS — M7582 Other shoulder lesions, left shoulder: Secondary | ICD-10-CM | POA: Diagnosis not present

## 2021-01-20 DIAGNOSIS — M7711 Lateral epicondylitis, right elbow: Secondary | ICD-10-CM | POA: Diagnosis not present

## 2021-02-15 DIAGNOSIS — R399 Unspecified symptoms and signs involving the genitourinary system: Secondary | ICD-10-CM | POA: Diagnosis not present

## 2021-03-08 DIAGNOSIS — R800 Isolated proteinuria: Secondary | ICD-10-CM | POA: Diagnosis not present

## 2021-03-08 DIAGNOSIS — N3941 Urge incontinence: Secondary | ICD-10-CM | POA: Diagnosis not present

## 2021-03-08 DIAGNOSIS — R3129 Other microscopic hematuria: Secondary | ICD-10-CM | POA: Diagnosis not present

## 2021-03-17 DIAGNOSIS — R059 Cough, unspecified: Secondary | ICD-10-CM | POA: Diagnosis not present

## 2021-03-17 DIAGNOSIS — U071 COVID-19: Secondary | ICD-10-CM | POA: Diagnosis not present

## 2021-03-17 DIAGNOSIS — N3000 Acute cystitis without hematuria: Secondary | ICD-10-CM | POA: Diagnosis not present

## 2021-06-21 ENCOUNTER — Ambulatory Visit (INDEPENDENT_AMBULATORY_CARE_PROVIDER_SITE_OTHER): Payer: PPO | Admitting: Family Medicine

## 2021-06-21 ENCOUNTER — Ambulatory Visit: Payer: Self-pay

## 2021-06-21 ENCOUNTER — Encounter: Payer: Self-pay | Admitting: Family Medicine

## 2021-06-21 VITALS — BP 150/80 | Ht 74.0 in | Wt 200.0 lb

## 2021-06-21 DIAGNOSIS — M25811 Other specified joint disorders, right shoulder: Secondary | ICD-10-CM

## 2021-06-21 DIAGNOSIS — M7541 Impingement syndrome of right shoulder: Secondary | ICD-10-CM | POA: Diagnosis not present

## 2021-06-21 MED ORDER — METHYLPREDNISOLONE ACETATE 40 MG/ML IJ SUSP
40.0000 mg | Freq: Once | INTRAMUSCULAR | Status: AC
  Administered 2021-06-21: 40 mg via INTRA_ARTICULAR

## 2021-06-21 NOTE — Progress Notes (Signed)
?  Jordan Lee - 76 y.o. male MRN [id number]  Date of birth: 08-02-1945 ? ?SUBJECTIVE:  Including CC & ROS.  ?No chief complaint on file. ? ? ?Jordan Lee is a 76 y.o. male that is presenting with acute right shoulder pain.  The pain has been ongoing for a few days.  The pain is severe in nature and limits his range of motion.  No injury or inciting event.  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  Does have a remote history of surgery in the shoulder from 15 years ago. ? ? ?Review of Systems ?See HPI  ? ?HISTORY: Past Medical, Surgical, Social, and Family History Reviewed & Updated per EMR.   ?Pertinent Historical Findings include: ? ?Past Medical History:  ?Diagnosis Date  ? Cataract   ? Right eye  ? Diabetes mellitus without complication ([place])   ? Hypertension   ? ? ?Past Surgical History:  ?Procedure Laterality Date  ? HERNIA REPAIR    ? Inguinal  ? ROTATOR CUFF REPAIR    ? ? ? ?PHYSICAL EXAM:  ?VS: BP (!) 150/80 (BP Location: Left Arm, Patient Position: Sitting)   Ht '6\' 2"'$  (1.88 m)   Wt 200 lb (90.7 kg)   BMI 25.68 kg/m?  ?Physical Exam ?Gen: NAD, alert, cooperative with exam, well-appearing ?MSK:  ?Neurovascularly intact   ? ? ?Aspiration/Injection Procedure Note ?[facility] ?Jan 01, 1946 ? ?Procedure: Injection ?Indications: Right shoulder pain ? ?Procedure Details ?Consent: Risks of procedure as well as the alternatives and risks of each were explained to the (patient/caregiver).  Consent for procedure obtained. ?Time Out: Verified patient identification, verified procedure, site/side was marked, verified correct patient position, special equipment/implants available, medications/allergies/relevent history reviewed, required imaging and test results available.  Performed.  The area was cleaned with iodine and alcohol swabs.   ? ?The right subacromial space was injected using 1 cc's of 40 mg Depo-Medrol and 4 cc's of 0.25% bupivacaine with a 22 1 1/2" needle.  Ultrasound was used. Images were obtained  in long views showing the injection.   ? ? ?A sterile dressing was applied. ? ?Patient did tolerate procedure well. ? ? ? ? ?ASSESSMENT & PLAN:  ? ?Shoulder impingement, right ?Acutely occurring.  Does repetitive activities as likely the source of his pain. ?-Counseled on home exercise therapy and supportive care. ?-Injection today. ?-Could consider physical therapy or shockwave therapy. ? ? ? ? ?

## 2021-06-21 NOTE — Patient Instructions (Signed)
Good to see you ?Please try heat before exercise and ice after  ?Please try the exercises   ?Please send me a message in MyChart with any questions or updates.  ?Please see me back in 4-6 weeks.  ? ?--Dr. Raeford Razor ? ?

## 2021-06-21 NOTE — Assessment & Plan Note (Signed)
Acutely occurring.  Does repetitive activities as likely the source of his pain. ?-Counseled on home exercise therapy and supportive care. ?-Injection today. ?-Could consider physical therapy or shockwave therapy. ?

## 2022-02-08 ENCOUNTER — Encounter (HOSPITAL_BASED_OUTPATIENT_CLINIC_OR_DEPARTMENT_OTHER): Payer: Self-pay | Admitting: Emergency Medicine

## 2022-02-08 ENCOUNTER — Other Ambulatory Visit: Payer: Self-pay

## 2022-02-08 ENCOUNTER — Emergency Department (HOSPITAL_BASED_OUTPATIENT_CLINIC_OR_DEPARTMENT_OTHER)
Admission: EM | Admit: 2022-02-08 | Discharge: 2022-02-08 | Disposition: A | Discharge status: Home / Self Care | Payer: PPO | Attending: Emergency Medicine | Admitting: Emergency Medicine

## 2022-02-08 ENCOUNTER — Emergency Department (HOSPITAL_BASED_OUTPATIENT_CLINIC_OR_DEPARTMENT_OTHER): Payer: PPO

## 2022-02-08 DIAGNOSIS — E119 Type 2 diabetes mellitus without complications: Secondary | ICD-10-CM | POA: Insufficient documentation

## 2022-02-08 DIAGNOSIS — R079 Chest pain, unspecified: Secondary | ICD-10-CM

## 2022-02-08 DIAGNOSIS — R0789 Other chest pain: Secondary | ICD-10-CM | POA: Diagnosis present

## 2022-02-08 DIAGNOSIS — I10 Essential (primary) hypertension: Secondary | ICD-10-CM | POA: Diagnosis not present

## 2022-02-08 LAB — CBC
HCT: 43.4 % (ref 39.0–52.0)
Hemoglobin: 14.4 g/dL (ref 13.0–17.0)
MCH: 26.7 pg (ref 26.0–34.0)
MCHC: 33.2 g/dL (ref 30.0–36.0)
MCV: 80.5 fL (ref 80.0–100.0)
Platelets: 191 10*3/uL (ref 150–400)
RBC: 5.39 MIL/uL (ref 4.22–5.81)
RDW: 14 % (ref 11.5–15.5)
WBC: 5.6 10*3/uL (ref 4.0–10.5)
nRBC: 0 % (ref 0.0–0.2)

## 2022-02-08 LAB — BASIC METABOLIC PANEL
Anion gap: 6 (ref 5–15)
BUN: 15 mg/dL (ref 8–23)
CO2: 24 mmol/L (ref 22–32)
Calcium: 8.4 mg/dL — ABNORMAL LOW (ref 8.9–10.3)
Chloride: 107 mmol/L (ref 98–111)
Creatinine, Ser: 0.95 mg/dL (ref 0.61–1.24)
GFR, Estimated: >60 mL/min (ref >60)
Glucose, Bld: 159 mg/dL — ABNORMAL HIGH (ref 70–99)
Potassium: 3.6 mmol/L (ref 3.5–5.1)
Sodium: 137 mmol/L (ref 135–145)

## 2022-02-08 LAB — TROPONIN I (HIGH SENSITIVITY): Troponin I (High Sensitivity): 4 ng/L (ref <18)

## 2022-02-08 MED ORDER — ACETAMINOPHEN 500 MG PO TABS
1000.0000 mg | ORAL_TABLET | Freq: Once | ORAL | Status: AC
  Administered 2022-02-08: 1000 mg via ORAL
  Filled 2022-02-08: qty 2
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
## 2022-07-11 ENCOUNTER — Encounter: Payer: Self-pay | Admitting: *Deleted

## 2023-07-05 ENCOUNTER — Emergency Department (HOSPITAL_BASED_OUTPATIENT_CLINIC_OR_DEPARTMENT_OTHER)

## 2023-07-05 ENCOUNTER — Encounter (HOSPITAL_BASED_OUTPATIENT_CLINIC_OR_DEPARTMENT_OTHER): Payer: Self-pay | Admitting: Urology

## 2023-07-05 ENCOUNTER — Other Ambulatory Visit: Payer: Self-pay

## 2023-07-05 ENCOUNTER — Emergency Department (HOSPITAL_BASED_OUTPATIENT_CLINIC_OR_DEPARTMENT_OTHER)
Admission: EM | Admit: 2023-07-05 | Discharge: 2023-07-06 | Disposition: A | Discharge status: Home / Self Care | Attending: Emergency Medicine | Admitting: Emergency Medicine

## 2023-07-05 DIAGNOSIS — Z7984 Long term (current) use of oral hypoglycemic drugs: Secondary | ICD-10-CM | POA: Insufficient documentation

## 2023-07-05 DIAGNOSIS — E119 Type 2 diabetes mellitus without complications: Secondary | ICD-10-CM | POA: Insufficient documentation

## 2023-07-05 DIAGNOSIS — R1032 Left lower quadrant pain: Secondary | ICD-10-CM | POA: Insufficient documentation

## 2023-07-05 LAB — COMPREHENSIVE METABOLIC PANEL WITH GFR
ALT: 19 U/L (ref 0–44)
AST: 22 U/L (ref 15–41)
Albumin: 3.4 g/dL — ABNORMAL LOW (ref 3.5–5.0)
Alkaline Phosphatase: 79 U/L (ref 38–126)
Anion gap: 9 (ref 5–15)
BUN: 12 mg/dL (ref 8–23)
CO2: 22 mmol/L (ref 22–32)
Calcium: 8.3 mg/dL — ABNORMAL LOW (ref 8.9–10.3)
Chloride: 103 mmol/L (ref 98–111)
Creatinine, Ser: 1.3 mg/dL — ABNORMAL HIGH (ref 0.61–1.24)
GFR, Estimated: 57 mL/min — ABNORMAL LOW (ref >60)
Glucose, Bld: 164 mg/dL — ABNORMAL HIGH (ref 70–99)
Potassium: 3.5 mmol/L (ref 3.5–5.1)
Sodium: 134 mmol/L — ABNORMAL LOW (ref 135–145)
Total Bilirubin: 0.7 mg/dL (ref 0.0–1.2)
Total Protein: 6.6 g/dL (ref 6.5–8.1)

## 2023-07-05 LAB — CBC WITH DIFFERENTIAL/PLATELET
Abs Immature Granulocytes: 0.03 10*3/uL (ref 0.00–0.07)
Basophils Absolute: 0.1 10*3/uL (ref 0.0–0.1)
Basophils Relative: 1 %
Eosinophils Absolute: 0.1 10*3/uL (ref 0.0–0.5)
Eosinophils Relative: 2 %
HCT: 41.6 % (ref 39.0–52.0)
Hemoglobin: 13.9 g/dL (ref 13.0–17.0)
Immature Granulocytes: 0 %
Lymphocytes Relative: 28 %
Lymphs Abs: 2 10*3/uL (ref 0.7–4.0)
MCH: 26.6 pg (ref 26.0–34.0)
MCHC: 33.4 g/dL (ref 30.0–36.0)
MCV: 79.7 fL — ABNORMAL LOW (ref 80.0–100.0)
Monocytes Absolute: 0.8 10*3/uL (ref 0.1–1.0)
Monocytes Relative: 11 %
Neutro Abs: 4.1 10*3/uL (ref 1.7–7.7)
Neutrophils Relative %: 58 %
Platelets: 194 10*3/uL (ref 150–400)
RBC: 5.22 MIL/uL (ref 4.22–5.81)
RDW: 14.1 % (ref 11.5–15.5)
WBC: 7.1 10*3/uL (ref 4.0–10.5)
nRBC: 0 % (ref 0.0–0.2)

## 2023-07-05 LAB — LIPASE, BLOOD: Lipase: 30 U/L (ref 11–51)

## 2023-07-05 MED ORDER — IOHEXOL 300 MG/ML  SOLN
100.0000 mL | Freq: Once | INTRAMUSCULAR | Status: AC | PRN
  Administered 2023-07-05: 100 mL via INTRAVENOUS

## 2023-07-05 NOTE — ED Provider Notes (Signed)
 [facility] Provider Note   CSN: [id number] Arrival date & time: 07/05/23  1858     History  Chief Complaint  Patient presents with   Abdominal Pain    Jordan Lee is a 78 y.o. male.   Abdominal Pain    Patient has a history of hyperlipidemia diabetes who presents ED for evaluation of abdominal pain.  Patient states he has been having some pain in the lower abdomen.  Mostly on the left side.  Patient states he has been having some pain for the last couple days.  He has been having normal bowel movements.  He is not having any vomiting.  Patient states he went to an urgent care today.  He had blood drawn a urine test and had x-rays.  The patient's plain films showed a few dilated loops with the differential of possible develop possible developing ileus or obstruction.  He was instructed to go to the ED.  Pt still has some discomfort now in the llq.  No vomiting.  No dysuria  Home Medications Prior to Admission medications   Medication Sig Start Date End Date Taking? Authorizing Provider  cetirizine (ZYRTEC) 10 MG tablet Take 10 mg by mouth as needed for allergies. Patient not taking: Reported on 10/21/2019    [provider]  lisinopril-hydrochlorothiazide (PRINZIDE,ZESTORETIC) 20-12.5 MG tablet Take 1 tablet by mouth daily.    [provider]  metformin (FORTAMET) 500 MG (OSM) 24 hr tablet Take 500 mg by mouth 2 (two) times daily with a meal.    [provider]  SIMVASTATIN PO Take by mouth.    [provider]  zolpidem (AMBIEN) 10 MG tablet Take 10 mg by mouth at bedtime as needed for sleep.    [provider]      Allergies    Shellfish allergy    Review of Systems   Review of Systems  Gastrointestinal:  Positive for abdominal pain.    Physical Exam Updated Vital Signs BP (!) 159/104   Pulse 73   Temp 98.3 F (36.8 C) (Oral)   Resp 16   Ht 1.88 m (6\' 2" )   Wt 93 kg   SpO2 97%    BMI 26.32 kg/m  Physical Exam Vitals and nursing note reviewed.  Constitutional:      General: He is not in acute distress.    Appearance: He is well-developed.  HENT:     Head: Normocephalic and atraumatic.     Right Ear: External ear normal.     Left Ear: External ear normal.  Eyes:     General: No scleral icterus.       Right eye: No discharge.        Left eye: No discharge.     Conjunctiva/sclera: Conjunctivae normal.  Neck:     Trachea: No tracheal deviation.  Cardiovascular:     Rate and Rhythm: Normal rate and regular rhythm.  Pulmonary:     Effort: Pulmonary effort is normal. No respiratory distress.     Breath sounds: Normal breath sounds. No stridor. No wheezing or rales.  Abdominal:     General: Bowel sounds are normal. There is no distension.     Palpations: Abdomen is soft.     Tenderness: There is abdominal tenderness in the left lower quadrant. There is no guarding or rebound.  Musculoskeletal:        General: No tenderness or deformity.     Cervical back: Neck supple.  Skin:    General: Skin is warm and dry.     Findings: No rash.  Neurological:     General: No focal deficit present.     Mental Status: He is alert.     Cranial Nerves: No cranial nerve deficit, dysarthria or facial asymmetry.     Sensory: No sensory deficit.     Motor: No abnormal muscle tone or seizure activity.     Coordination: Coordination normal.  Psychiatric:        Mood and Affect: Mood normal.     ED Results / Procedures / Treatments   Labs (all labs ordered are listed, but only abnormal results are displayed) Labs Reviewed  COMPREHENSIVE METABOLIC PANEL WITH GFR - Abnormal; Notable for the following components:      Result Value   Sodium 134 (*)    Glucose, Bld 164 (*)    Creatinine, Ser 1.30 (*)    Calcium 8.3 (*)    Albumin 3.4 (*)    GFR, Estimated 57 (*)    All other components within normal limits  CBC WITH DIFFERENTIAL/PLATELET - Abnormal; Notable for the  following components:   MCV 79.7 (*)    All other components within normal limits  LIPASE, BLOOD    EKG None  Radiology No results found.  Procedures Procedures    Medications Ordered in ED Medications  iohexol (OMNIPAQUE) 300 MG/ML solution 100 mL (100 mLs Intravenous Contrast Given 07/05/23 2308)    ED Course/ Medical Decision Making/ A&P Clinical Course as of 07/06/23 0001  Wed Jul 05, 2023  2135 Urinalysis results from the other medical institution reviewed.  No acute abnormality [JK]    Clinical Course User Index [JK] Linwood Dibbles, MD                                 Medical Decision Making Amount and/or Complexity of Data Reviewed Labs: ordered. Decision-making details documented in ED Course. Radiology: ordered.  Risk Prescription drug management.   Patient presented to the ED for evaluation of an abnormal x-ray suggesting possible ileus or obstruction.  Patient has not been having any vomiting.  No abdominal distention.  He does have some tenderness left lower quadrant.  Initial laboratory tests are unremarkable.  CT scan has been ordered.  Is currently pending.  Case turned over to Dr. Judd Lien at shift change        Final Clinical Impression(s) / ED Diagnoses Final diagnoses:  None    Rx / DC Orders ED Discharge Orders     None         Linwood Dibbles, MD 07/06/23 0001

## 2023-07-05 NOTE — ED Triage Notes (Signed)
 Pt was seen at [facility] today and was told to come to ER for possible bowel blockage  Slight lower abdominal pain noted that started 2 days ago  BM today

## 2023-07-06 ENCOUNTER — Other Ambulatory Visit (HOSPITAL_BASED_OUTPATIENT_CLINIC_OR_DEPARTMENT_OTHER): Payer: Self-pay

## 2023-07-06 ENCOUNTER — Telehealth (HOSPITAL_BASED_OUTPATIENT_CLINIC_OR_DEPARTMENT_OTHER): Payer: Self-pay | Admitting: Emergency Medicine

## 2023-07-06 MED ORDER — CIPROFLOXACIN HCL 500 MG PO TABS: 500.0000 mg | ORAL_TABLET | Freq: Two times a day (BID) | ORAL | 0 refills | Status: AC

## 2023-07-06 MED ORDER — METRONIDAZOLE 500 MG PO TABS: 500.0000 mg | ORAL_TABLET | Freq: Three times a day (TID) | ORAL | 0 refills | Status: AC

## 2023-07-06 NOTE — Discharge Instructions (Signed)
 We will call you if your CAT scan indicate you require further treatment or need to take additional action.  Return to the ER if you develop worsening pain, high fevers, bloody stools, or for other new and concerning symptoms.

## 2023-07-06 NOTE — ED Provider Notes (Addendum)
  Physical Exam  BP (!) 159/104   Pulse 73   Temp 98.3 F (36.8 C) (Oral)   Resp 16   Ht 6\' 2"  (1.88 m)   Wt 93 kg   SpO2 97%   BMI 26.32 kg/m   Physical Exam Vitals and nursing note reviewed.  Constitutional:      Appearance: He is well-developed.  Skin:    General: Skin is warm.  Neurological:     Mental Status: He is alert and oriented to person, place, and time.     Procedures  Procedures  ED Course / MDM   Clinical Course as of 07/06/23 0138  Wed Jul 05, 2023  2135 Urinalysis results from the other medical institution reviewed.  No acute abnormality [JK]    Clinical Course User Index [JK] Linwood Dibbles, MD   Medical Decision Making Amount and/or Complexity of Data Reviewed Labs: ordered. Radiology: ordered.  Risk Prescription drug management.   Care assumed from Dr. Lynelle Doctor at shift change.  Patient sent here from urgent care for evaluation of left lower quadrant abdominal pain.  Patient arrives here with stable vital signs and is afebrile.  Laboratory studies are reassuring with no leukocytosis, no abnormal LFTs or lipase, and no electrolyte derangement.  Care was signed out to me awaiting results of CT scan of the abdomen and pelvis.  While waiting for the radiology results, patient informed the nurse that he was anxious to go home.  I discussed the situation with the patient and he is requesting to be discharged.  He is somewhat upset over the delay in radiology reading his CT scan.  Patient will be discharged at his request.  I have informed him that I will follow-up the results of his CAT scan and we will call him if anything requires further treatment or requires additional action.       Geoffery Lyons, MD 07/06/23 0140   ADDENDUM: Patient's CT scan has returned and shows uncomplicated acute diverticulitis.  I attempted to call and notify the patient of this finding, however his phone went to voicemail.  I did leave a message and have sent Cipro and Flagyl  to his pharmacy.    Geoffery Lyons, MD 07/06/23 [phone]
